# Patient Record
Sex: Male | Born: 1980 | Race: Black or African American | Hispanic: No | Marital: Married
Health system: Midwestern US, Community
[De-identification: ages and names within clinical notes are randomized; demographics above are authoritative.]

## PROBLEM LIST (undated history)

## (undated) DIAGNOSIS — Z932 Ileostomy status: Secondary | ICD-10-CM

## (undated) DIAGNOSIS — E119 Type 2 diabetes mellitus without complications: Principal | ICD-10-CM

## (undated) DIAGNOSIS — K5792 Diverticulitis of intestine, part unspecified, without perforation or abscess without bleeding: Principal | ICD-10-CM

## (undated) DIAGNOSIS — Z1211 Encounter for screening for malignant neoplasm of colon: Principal | ICD-10-CM

## (undated) DIAGNOSIS — Z9049 Acquired absence of other specified parts of digestive tract: Secondary | ICD-10-CM

## (undated) DIAGNOSIS — K648 Other hemorrhoids: Principal | ICD-10-CM

## (undated) DIAGNOSIS — Z9889 Other specified postprocedural states: Principal | ICD-10-CM

## (undated) DIAGNOSIS — G4733 Obstructive sleep apnea (adult) (pediatric): Secondary | ICD-10-CM

## (undated) DIAGNOSIS — Z48815 Encounter for surgical aftercare following surgery on the digestive system: Principal | ICD-10-CM

---

## 2014-05-15 ENCOUNTER — Ambulatory Visit (INDEPENDENT_AMBULATORY_CARE_PROVIDER_SITE_OTHER): Payer: Managed Care, Other (non HMO) | Admitting: Emergency Medicine

## 2014-05-15 VITALS — BP 140/90 | HR 73 | Temp 99.8°F | Resp 16 | Ht 66.0 in | Wt 187.0 lb

## 2014-05-15 DIAGNOSIS — J40 Bronchitis, not specified as acute or chronic: Secondary | ICD-10-CM

## 2014-05-15 DIAGNOSIS — J0140 Acute pansinusitis, unspecified: Secondary | ICD-10-CM

## 2014-05-15 DIAGNOSIS — J209 Acute bronchitis, unspecified: Secondary | ICD-10-CM

## 2014-05-15 MED ORDER — ALBUTEROL SULFATE HFA 108 (90 BASE) MCG/ACT IN AERS: 2.0000 | INHALATION_SPRAY | RESPIRATORY_TRACT | Status: AC | PRN

## 2014-05-15 MED ORDER — ALBUTEROL SULFATE (2.5 MG/3ML) 0.083% IN NEBU
5.0000 mg | INHALATION_SOLUTION | Freq: Once | RESPIRATORY_TRACT | Status: AC
  Administered 2014-05-15: 5 mg via RESPIRATORY_TRACT

## 2014-05-15 MED ORDER — IPRATROPIUM BROMIDE 0.02 % IN SOLN
0.5000 mg | Freq: Once | RESPIRATORY_TRACT | Status: AC
Start: 2014-05-15 — End: 2014-05-15
  Administered 2014-05-15: 0.5 mg via RESPIRATORY_TRACT

## 2014-05-15 MED ORDER — AMOXICILLIN-POT CLAVULANATE 875-125 MG PO TABS: 1.0000 | ORAL_TABLET | Freq: Two times a day (BID) | ORAL | Status: DC

## 2014-05-15 MED ORDER — PSEUDOEPHEDRINE-GUAIFENESIN ER 60-600 MG PO TB12: 1.0000 | ORAL_TABLET | Freq: Two times a day (BID) | ORAL | Status: DC

## 2014-05-15 MED ORDER — HYDROCOD POLST-CHLORPHEN POLST 10-8 MG/5ML PO LQCR: 5.0000 mL | Freq: Two times a day (BID) | ORAL | Status: DC | PRN

## 2014-05-15 NOTE — Patient Instructions (Signed)
Metered Dose Inhaler (No Spacer Used)  Inhaled medicines are the basis of treatment for asthma and other breathing problems. Inhaled medicine can only be effective if used properly. Good technique assures that the medicine reaches the lungs.  Metered dose inhalers (MDIs) are used to deliver a variety of inhaled medicines. These include quick relief or rescue medicines (such as bronchodilators) and controller medicines (such as corticosteroids). The medicine is delivered by pushing down on a metal canister to release a set amount of spray.  If you are using different kinds of inhalers, use your quick relief medicine to open the airways 10-15 minutes before using a steroid, if instructed to do so by your health care provider. If you are unsure which inhalers to use and the order of using them, ask your health care provider, nurse, or respiratory therapist.  HOW TO USE THE INHALER  1. Remove the cap from the inhaler.  2. If you are using the inhaler for the first time, you will need to prime it. Shake the inhaler for 5 seconds and release four puffs into the air, away from your face. Ask your health care provider or pharmacist if you have questions about priming your inhaler.  3. Shake the inhaler for 5 seconds before each breath in (inhalation).  4. Position the inhaler so that the top of the canister faces up.  5. Put your index finger on the top of the medicine canister. Your thumb supports the bottom of the inhaler.  6. Open your mouth.  7. Either place the inhaler between your teeth and place your lips tightly around the mouthpiece, or hold the inhaler 1-2 inches away from your open mouth. If you are unsure of which technique to use, ask your health care provider.  8. Breathe out (exhale) normally and as completely as possible.  9. Press the canister down with the index finger to release the medicine.  10. At the same time as the canister is pressed, inhale deeply and slowly until your lungs are completely filled.  This should take 4-6 seconds. Keep your tongue down.  11. Hold the medicine in your lungs for 5-10 seconds (10 seconds is best). This helps the medicine get into the small airways of your lungs.  12. Breathe out slowly, through pursed lips. Whistling is an example of pursed lips.  13. Wait at least 1 minute between puffs. Continue with the above steps until you have taken the number of puffs your health care provider has ordered. Do not use the inhaler more than your health care provider directs you to.  14. Replace the cap on the inhaler.  15. Follow the directions from your health care provider or the inhaler insert for cleaning the inhaler.  If you are using a steroid inhaler, after your last puff, rinse your mouth with water, gargle, and spit out the water. Do not swallow the water.  AVOID:  · Inhaling before or after starting the spray of medicine. It takes practice to coordinate your breathing with triggering the spray.  · Inhaling through the nose (rather than the mouth) when triggering the spray.  HOW TO DETERMINE IF YOUR INHALER IS FULL OR NEARLY EMPTY  You cannot know when an inhaler is empty by shaking it. Some inhalers are now being made with dose counters. Ask your health care provider for a prescription that has a dose counter if you feel you need that extra help. If your inhaler does not have a counter, ask your health care   provider to help you determine the date you need to refill your inhaler. Write the refill date on a calendar or your inhaler canister. Refill your inhaler 7-10 days before it runs out. Be sure to keep an adequate supply of medicine. This includes making sure it has not expired, and making sure you have a spare inhaler.  SEEK MEDICAL CARE IF:  · Symptoms are only partially relieved with your inhaler.  · You are having trouble using your inhaler.  · You experience an increase in phlegm.  SEEK IMMEDIATE MEDICAL CARE IF:  · You feel little or no relief with your inhalers. You are still  wheezing and feeling shortness of breath, tightness in your chest, or both.  · You have dizziness, headaches, or a fast heart rate.  · You have chills, fever, or night sweats.  · There is a noticeable increase in phlegm production, or there is blood in the phlegm.  MAKE SURE YOU:  · Understand these instructions.  · Will watch your condition.  · Will get help right away if you are not doing well or get worse.  Document Released: 03/27/2007 Document Revised: 10/14/2013 Document Reviewed: 11/15/2012  ExitCare® Patient Information ©2015 [employer]. This information is not intended to replace advice given to you by your health care provider. Make sure you discuss any questions you have with your health care provider.

## 2014-05-15 NOTE — Progress Notes (Signed)
[facility] [address], [place] [id number] [phone]  Date:  05/15/2014   Name:  Travis Short   DOB:  [birth year]   MRN:  [id number]  PCP:  No PCP Per Patient    Chief Complaint: Cough and Bumps   History of Present Illness:  Travis Short is a 33 y.o. very pleasant male patient who presents with the following:  Ill past week with nasal congestion and post nasal drip.  Has mucopurulent nasal discharge. Cough productive mucopurulent sputum associated with wheezing and exertional shortness of breath  No history of asthma No nausea or vomiting. Says developed a fever last night. No rash or stool change No improvement with over the counter medications or other home remedies.  Denies other complaint or health concern today.   There are no active problems to display for this patient.   History reviewed. No pertinent past medical history.  History reviewed. No pertinent past surgical history.  History  Substance Use Topics  . Smoking status: Current Every Day Smoker  . Smokeless tobacco: Not on file  . Alcohol Use: Not on file    History reviewed. No pertinent family history.  Allergies  Allergen Reactions  . Shellfish Allergy Anaphylaxis    Medication list has been reviewed and updated.  No current outpatient prescriptions on file prior to visit.   No current facility-administered medications on file prior to visit.    Review of Systems:  As per HPI, otherwise negative.    Physical Examination: Filed Vitals:   05/15/14 1400  BP: 140/90  Pulse: 73  Temp: 99.8 F (37.7 C)  Resp: 16   Filed Vitals:   05/15/14 1400  Height: 5\' 6"  (1.676 m)  Weight: 187 lb (84.823 kg)   Body mass index is 30.2 kg/(m^2). Ideal Body Weight: Weight in (lb) to have BMI = 25: 154.6  GEN: WDWN, NAD, Non-toxic, A & O x 3 HEENT: Atraumatic, Normocephalic. Neck supple. No masses, No LAD. Ears and Nose: No external deformity. CV: RRR, No  M/G/R. No JVD. No thrill. No extra heart sounds. PULM: diffuse  Wheezes, no crackles, rhonchi. No retractions. No resp. distress. No accessory muscle use. ABD: S, NT, ND, +BS. No rebound. No HSM. EXTR: No c/c/e NEURO Normal gait.  PSYCH: Normally interactive. Conversant. Not depressed or anxious appearing.  Calm demeanor.    Assessment and Plan: Sinusitis  Bronchitis with bronchospasm Albuterol tussionex augmentin mucinenex  Signed,  Phillips OdorJeffery Lason Eveland, MD

## 2014-11-03 ENCOUNTER — Ambulatory Visit (INDEPENDENT_AMBULATORY_CARE_PROVIDER_SITE_OTHER): Payer: Managed Care, Other (non HMO) | Admitting: Family Medicine

## 2014-11-03 ENCOUNTER — Ambulatory Visit (INDEPENDENT_AMBULATORY_CARE_PROVIDER_SITE_OTHER): Payer: Managed Care, Other (non HMO)

## 2014-11-03 VITALS — BP 116/80 | HR 56 | Temp 98.4°F | Resp 16 | Ht 66.0 in | Wt 208.0 lb

## 2014-11-03 DIAGNOSIS — S4991XA Unspecified injury of right shoulder and upper arm, initial encounter: Secondary | ICD-10-CM | POA: Diagnosis not present

## 2014-11-03 DIAGNOSIS — L739 Follicular disorder, unspecified: Secondary | ICD-10-CM

## 2014-11-03 MED ORDER — DICLOFENAC SODIUM 75 MG PO TBEC: 75.0000 mg | DELAYED_RELEASE_TABLET | Freq: Two times a day (BID) | ORAL | Status: AC

## 2014-11-03 MED ORDER — TETRACYCLINE HCL 500 MG PO CAPS: 500.0000 mg | ORAL_CAPSULE | Freq: Every day | ORAL | Status: AC

## 2014-11-03 NOTE — Progress Notes (Signed)
° °  Subjective:  This chart was scribed for Elvina SidleKurt Lauenstein, MD by Stann Oresung-Kai Tsai, Medical Scribe. This patient was seen in room 12 and the patient's care was started 12:01 PM.    Patient ID: Travis Short, male    DOB: [birth year], 34 y.o.   MRN: [id number]  HPI Travis Short is a 34 y.o. male who [facility] complaining of gradual onset intermittent bumps on the head.   He also has some shoulder pain starting about 2 months ago due to work [employer]ist[employer]. He's never had physical therapy for it. He took ibuprofen for it with some relief.  He has also fatigue from early morning shift.   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     Review of Systems  Musculoskeletal: Positive for arthralgias (right shoulder).  Skin: Positive for rash (bumps on head).       Objective:   Physical Exam  Nursing note and vitals reviewed.  patient has a normal appearance of his right shoulder. He has minimal tenderness in the joint line anteriorly, his range of motion is complete but he is having trouble moving it quickly both with internal rotation and abduction. He has good muscle definition in his arm and the rest the range of motion of his right arm is normal. [facility] reading (PRIMARY) by  Dr. Milus GlazierLauenstein: Right shoulder shows no dislocation, no bony abnormality..   Assessment & Plan:   This chart was scribed in my presence and reviewed by me personally.    ICD-9-CM ICD-10-CM   1. Right shoulder injury, initial encounter 959.2 S49.91XA DG Shoulder Right     DG Shoulder Right  2. Folliculitis 704.8 L73.9 tetracycline (ACHROMYCIN,SUMYCIN) 500 MG capsule   This appears to be an ongoing shoulder strain. He has some mild folliculitis in his occipital scalp.  I'm thinking that if he does some range of motion exercises and takes an anti-inflammatory for the next couple weeks, the shoulder should improve.  Signed, Elvina SidleKurt Lauenstein, MD

## 2014-11-03 NOTE — Patient Instructions (Signed)
I like you to use the resistance band to strengthen the rotator cuff muscles of the right shoulder which has been injured. Please perform exercises demonstrated 3 times a day for the next 2 weeks and take the anti-inflammatory medicine as directed.  The folliculitis is very difficult to treat. Usually does respond to tetracycline taken once a day. You may want to try this for a month or 2 and then see if it doesn't stay away.

## 2014-11-04 ENCOUNTER — Telehealth: Payer: Self-pay

## 2014-11-04 DIAGNOSIS — L739 Follicular disorder, unspecified: Secondary | ICD-10-CM

## 2014-11-04 MED ORDER — DOXYCYCLINE HYCLATE 100 MG PO TABS: 100.0000 mg | ORAL_TABLET | Freq: Every day | ORAL | Status: AC

## 2014-11-04 NOTE — Telephone Encounter (Signed)
Pharm reports that tetracycline is not being manufactured in any strength any more. Dr L, can you send in a Rx for an alternative?

## 2014-11-04 NOTE — Telephone Encounter (Signed)
If tetracycline is no longer available, why is it in EPIC????

## 2014-11-04 NOTE — Telephone Encounter (Signed)
I have no idea Dr, we did not even know this until now. Epic fails to update things like this. I apologize, I can research how to fix this for the future.

## 2018-04-10 LAB — COMPREHENSIVE METABOLIC PANEL
ALT: 45 U/L — ABNORMAL HIGH (ref 0–41)
AST: 36 U/L (ref 0–40)
Albumin/Globulin Ratio: 1.6 mmol/L (ref 1.00–2.00)
Albumin: 4.4 g/dL (ref 3.5–5.2)
Alk Phosphatase: 75 U/L (ref 40–130)
Anion Gap: 8 mmol/L (ref 2–17)
BUN: 17 mg/dL (ref 6–20)
CO2: 29 mmol/L (ref 22–29)
Calcium: 9.6 mg/dL (ref 8.6–10.0)
Chloride: 104 mmol/L (ref 98–107)
Creatinine: 1.1 mg/dL (ref 0.7–1.3)
GFR African American: 97 mL/min/{1.73_m2} (ref >=90)
GFR Non-African American: 84 mL/min/{1.73_m2} — ABNORMAL LOW (ref >=90)
Globulin: 3 g/dL (ref 1.9–4.4)
Glucose: 102 mg/dL — ABNORMAL HIGH (ref 70–99)
OSMOLALITY CALCULATED: 283 mOsm/kg (ref 270–287)
Potassium: 4.6 mmol/L (ref 3.5–5.3)
Sodium: 141 mmol/L (ref 135–145)
Total Bilirubin: 0.3 mg/dL (ref 0.00–1.20)
Total Protein: 7.2 g/dL (ref 6.4–8.3)

## 2018-04-10 LAB — HEMOGLOBIN A1C
Est. Avg. Glucose, WB: 134
Est. Avg. Glucose-calculated: 147
Hemoglobin A1C: 6.3 % — ABNORMAL HIGH (ref 4.0–6.0)

## 2018-04-10 LAB — LIPID PANEL
Chol/HDL Ratio: 4.9 — ABNORMAL HIGH (ref 0.0–4.4)
Cholesterol: 165 mg/dL (ref 100–200)
HDL: 34 mg/dL — ABNORMAL LOW (ref 55–72)
LDL Cholesterol: 85.4 mg/dL (ref 0.0–100.0)
LDL/HDL Ratio: 2.5
Triglycerides: 228 mg/dL — ABNORMAL HIGH (ref 0–149)
VLDL: 45.6 mg/dL — ABNORMAL HIGH (ref 5.0–40.0)

## 2018-04-10 LAB — CBC WITH AUTO DIFFERENTIAL
Absolute Eos #: 0.1 10*3/uL (ref 0.0–0.5)
Absolute Lymph #: 2.2 10*3/uL (ref 1.0–3.2)
Absolute Mono #: 0.4 10*3/uL (ref 0.3–1.0)
Basophils %: 0.8 % (ref 0.0–2.0)
Eosinophils %: 2.6 % (ref 0.0–7.0)
Hematocrit: 41.7 % (ref 38.0–52.0)
Hemoglobin: 13.5 g/dL (ref 13.0–17.3)
Lymphocytes: 53.6 % — ABNORMAL HIGH (ref 15.0–45.0)
MCH: 23 pg — ABNORMAL LOW (ref 27.0–34.5)
MCHC: 32.4 g/dL (ref 32.0–36.0)
MCV: 71.1 fL — ABNORMAL LOW (ref 84.0–100.0)
MPV: 9.5 fL (ref 7.2–13.2)
Monocytes: 9.7 % (ref 4.0–12.0)
Neutrophils %: 33.3 % — ABNORMAL LOW (ref 42.0–74.0)
Neutrophils Absolute: 1.4 10*3/uL — ABNORMAL LOW (ref 1.6–7.3)
Platelets: 198 10*3/uL (ref 140–440)
RBC: 5.86 x10e6/mcL — ABNORMAL HIGH (ref 4.00–5.60)
RDW: 14.8 % (ref 11.0–16.0)
WBC: 4.2 10*3/uL (ref 3.8–10.6)

## 2018-04-10 LAB — VITAMIN D 25 HYDROXY: Vit D, 25-Hydroxy: 21.9 ng/mL — ABNORMAL LOW (ref 30.0–90.0)

## 2018-04-10 LAB — TSH WITH REFLEX TO FT4: TSH: 3.11 mcIU/mL (ref 0.358–3.740)

## 2018-12-19 LAB — COMPREHENSIVE METABOLIC PANEL
ALT: 41 U/L (ref 0–41)
AST: 24 U/L (ref 0–40)
Albumin/Globulin Ratio: 1.3 mmol/L (ref 1.00–2.00)
Albumin: 3.8 g/dL (ref 3.5–5.2)
Alk Phosphatase: 74 U/L (ref 40–130)
Anion Gap: 9 mmol/L (ref 2–17)
BUN: 13 mg/dL (ref 6–20)
CO2: 26 mmol/L (ref 22–29)
Calcium: 9 mg/dL (ref 8.6–10.0)
Chloride: 104 mmol/L (ref 98–107)
Creatinine: 1 mg/dL (ref 0.7–1.3)
GFR African American: 109 mL/min/{1.73_m2} (ref >=90)
GFR Non-African American: 94 mL/min/{1.73_m2} (ref >=90)
Globulin: 3 g/dL (ref 1.9–4.4)
Glucose: 99 mg/dL (ref 70–99)
OSMOLALITY CALCULATED: 278 mOsm/kg (ref 270–287)
Potassium: 4.5 mmol/L (ref 3.5–5.3)
Sodium: 140 mmol/L (ref 135–145)
Total Bilirubin: 0.3 mg/dL (ref 0.00–1.20)
Total Protein: 6.8 g/dL (ref 6.4–8.3)

## 2018-12-19 LAB — CBC WITH AUTO DIFFERENTIAL
Absolute Baso #: 0 10*3/uL (ref 0.0–0.2)
Absolute Eos #: 0.2 10*3/uL (ref 0.0–0.5)
Absolute Lymph #: 2.5 10*3/uL (ref 1.0–3.2)
Absolute Mono #: 0.4 10*3/uL (ref 0.3–1.0)
Basophils %: 0.6 % (ref 0.0–2.0)
Eosinophils %: 3.4 % (ref 0.0–7.0)
Hematocrit: 43.8 % (ref 38.0–52.0)
Hemoglobin: 13.4 g/dL (ref 12.0–17.3)
Immature Grans (Abs): 0 10*3/uL
Immature Granulocytes: 0.2 % (ref 0.1–0.6)
Lymphocytes: 53 % — ABNORMAL HIGH (ref 15.0–45.0)
MCH: 21.9 pg — ABNORMAL LOW (ref 27.0–34.5)
MCHC: 30.6 g/dL — ABNORMAL LOW (ref 32.0–36.0)
MCV: 71.5 fL — ABNORMAL LOW (ref 84.0–100.0)
MPV: 10.9 fL (ref 7.2–13.2)
Monocytes: 8.9 % (ref 4.0–12.0)
NRBC Absolute: 0 10*3/uL
NRBC Automated: 0 % (ref 0.0–0.2)
Neutrophils %: 33.9 % — ABNORMAL LOW (ref 42.0–74.0)
Neutrophils Absolute: 1.6 10*3/uL (ref 1.6–7.3)
Platelets: 384 10*3/uL (ref 140–440)
RBC: 6.13 x10e6/mcL — ABNORMAL HIGH (ref 4.00–5.20)
RDW: 14.1 % (ref 11.0–16.0)
WBC: 4.7 10*3/uL (ref 3.8–10.6)

## 2018-12-19 LAB — HEMOGLOBIN A1C
Est. Avg. Glucose, WB: 146
Est. Avg. Glucose-calculated: 161
Hemoglobin A1C: 6.7 % — ABNORMAL HIGH (ref 4.0–6.0)

## 2018-12-19 LAB — LIPID PANEL
Chol/HDL Ratio: 4.7 — ABNORMAL HIGH (ref 0.0–4.4)
Cholesterol: 121 mg/dL (ref 100–200)
HDL: 26 mg/dL — ABNORMAL LOW (ref 55–72)
LDL Cholesterol: 66 mg/dL (ref 0.0–100.0)
LDL/HDL Ratio: 2.5
Triglycerides: 145 mg/dL (ref 0–149)
VLDL: 29 mg/dL (ref 5.0–40.0)

## 2018-12-19 LAB — TSH WITH REFLEX TO FT4: TSH: 1.58 mcIU/mL (ref 0.358–3.740)

## 2018-12-28 LAB — COVID-19: CORONAVIRUS (COVID-19), NAA: NOT DETECTED

## 2019-01-25 LAB — CBC WITH MANUAL DIFFERENTIAL
Absolute Eos #: 0.1 10*3/uL (ref 0.0–0.5)
Absolute Lymph #: 2.6 10*3/uL (ref 1.0–3.2)
Absolute Mono #: 0.7 10*3/uL (ref 0.3–1.0)
Eosinophils %: 1 % (ref 0–7)
Hematocrit: 43.2 % (ref 38.0–52.0)
Hemoglobin: 13.4 g/dL (ref 13.0–17.3)
Lymphocytes: 48 % — ABNORMAL HIGH (ref 15–45)
Lymphs, Absolute Count, Reactive: 0.2 10*3/uL
MCH: 22.4 pg — ABNORMAL LOW (ref 27.0–34.5)
MCHC: 31 g/dL — ABNORMAL LOW (ref 32.0–36.0)
MCV: 72.2 fL — ABNORMAL LOW (ref 84.0–100.0)
MPV: 10.7 fL (ref 7.2–13.2)
Monocytes: 12 % (ref 4–12)
Neutrophils %. Manual count: 35 % — ABNORMAL LOW (ref 42–74)
Neutrophils Absolute: 1.9 10*3/uL (ref 1.6–7.3)
Platelet Estimate: ADEQUATE
Platelets: 225 10*3/uL (ref 140–440)
RBC Morphology: NORMAL
RBC: 5.98 x10e6/mcL — ABNORMAL HIGH (ref 4.00–5.60)
RDW: 16.5 % — ABNORMAL HIGH (ref 11.0–16.0)
Reactive Lymphocytes: 4 %
WBC: 5.5 10*3/uL (ref 3.8–10.6)

## 2019-01-25 LAB — IRON AND TIBC
Iron Saturation: 33 % (ref 20–40)
Iron: 91 ug/dL (ref 59–158)
TIBC: 278 ug/dL (ref 250–450)
UIBC: 187 ug/dL (ref 112.0–347.0)

## 2019-01-25 LAB — FERRITIN: Ferritin: 215.8 ng/mL (ref 30.0–400.0)

## 2019-01-25 LAB — FOLATE: Folate: 6.81 ng/mL (ref 4.80–24.20)

## 2019-01-25 LAB — RETICULOCYTES
RBC: 5.98 x10e6/mcL — ABNORMAL HIGH (ref 4.00–5.60)
Retic Ct Abs: 0.0765 /mcL (ref 0.0235–0.1220)
Retic Ct Pct: 1.3 % (ref 0.5–2.0)

## 2019-01-25 LAB — LACTATE DEHYDROGENASE: LD: 170 U/L (ref 135–225)

## 2019-01-25 LAB — TRANSFERRIN: Transferrin: 233.3 mg/dL (ref 200.0–360.0)

## 2019-01-25 LAB — VITAMIN B12: Vitamin B-12: 500 pg/mL (ref 232–1245)

## 2019-01-28 LAB — HEMOGLOBINOPATHY EVALUATION
Hemoglobin A2: 2.2 % (ref 1.8–3.2)
Hemoglobin A: 97.8 % (ref 96.4–98.8)
Hemoglobin C: 0 %
Hemoglobin F: 0 % (ref 0.0–2.0)
Hemoglobin S: 0 %
Hemoglobin Variant: 0 %
Hgb Solubility: NEGATIVE

## 2019-06-21 LAB — COMPREHENSIVE METABOLIC PANEL
ALT: 36 U/L (ref 0–41)
AST: 28 U/L (ref 0–40)
Albumin/Globulin Ratio: 1.7 mmol/L (ref 1.00–2.00)
Albumin: 4.4 g/dL (ref 3.5–5.2)
Alk Phosphatase: 71 U/L (ref 40–130)
Anion Gap: 10 mmol/L (ref 2–17)
BUN: 14 mg/dL (ref 6–20)
CO2: 27 mmol/L (ref 22–29)
Calcium: 9.7 mg/dL (ref 8.6–10.0)
Chloride: 102 mmol/L (ref 98–107)
Creatinine: 0.9 mg/dL (ref 0.7–1.3)
GFR African American: 123 mL/min/{1.73_m2} (ref >=90)
GFR Non-African American: 106 mL/min/{1.73_m2} (ref >=90)
Globulin: 3 g/dL (ref 1.9–4.4)
Glucose: 93 mg/dL (ref 70–99)
OSMOLALITY CALCULATED: 278 mOsm/kg (ref 270–287)
Potassium: 4.5 mmol/L (ref 3.5–5.3)
Sodium: 139 mmol/L (ref 135–145)
Total Bilirubin: 0.6 mg/dL (ref 0.00–1.20)
Total Protein: 7 g/dL (ref 6.4–8.3)

## 2019-06-21 LAB — CBC WITH AUTO DIFFERENTIAL
Absolute Baso #: 0 10*3/uL (ref 0.0–0.2)
Absolute Eos #: 0.2 10*3/uL (ref 0.0–0.5)
Absolute Lymph #: 2.2 10*3/uL (ref 1.0–3.2)
Absolute Mono #: 0.5 10*3/uL (ref 0.3–1.0)
Basophils %: 0.6 % (ref 0.0–2.0)
Eosinophils %: 3.3 % (ref 0.0–7.0)
Hematocrit: 47.5 % (ref 38.0–52.0)
Hemoglobin: 15 g/dL (ref 13.0–17.3)
Immature Grans (Abs): 0.02 10*3/uL (ref 0.00–0.06)
Immature Granulocytes: 0.4 % (ref 0.1–0.6)
Lymphocytes: 45.5 % — ABNORMAL HIGH (ref 15.0–45.0)
MCH: 22.5 pg — ABNORMAL LOW (ref 27.0–34.5)
MCHC: 31.6 g/dL — ABNORMAL LOW (ref 32.0–36.0)
MCV: 71.1 fL — ABNORMAL LOW (ref 84.0–100.0)
MPV: 11.2 fL (ref 7.2–13.2)
Monocytes: 10 % (ref 4.0–12.0)
NRBC Absolute: 0 10*3/uL (ref 0.000–0.012)
NRBC Automated: 0 % (ref 0.0–0.2)
Neutrophils %: 40.2 % — ABNORMAL LOW (ref 42.0–74.0)
Neutrophils Absolute: 2 10*3/uL (ref 1.6–7.3)
Platelets: 219 10*3/uL (ref 140–440)
RBC: 6.68 x10e6/mcL — ABNORMAL HIGH (ref 4.00–5.60)
RDW: 17.1 % — ABNORMAL HIGH (ref 11.0–16.0)
WBC: 4.9 10*3/uL (ref 3.8–10.6)

## 2019-06-21 LAB — LIPID PANEL
Chol/HDL Ratio: 4.8 — ABNORMAL HIGH (ref 0.0–4.4)
Cholesterol: 184 mg/dL (ref 100–200)
HDL: 38 mg/dL — ABNORMAL LOW (ref 55–72)
LDL Cholesterol: 111 mg/dL — ABNORMAL HIGH (ref 0.0–100.0)
LDL/HDL Ratio: 2.9
Triglycerides: 176 mg/dL — ABNORMAL HIGH (ref 0–149)
VLDL: 35.2 mg/dL (ref 5.0–40.0)

## 2019-06-21 LAB — HEMOGLOBIN A1C
Est. Avg. Glucose, WB: 131
Est. Avg. Glucose-calculated: 143
Hemoglobin A1C: 6.2 % — ABNORMAL HIGH (ref 4.0–6.0)

## 2019-06-22 LAB — TSH WITH REFLEX TO FT4: TSH: 1.78 mcIU/mL (ref 0.358–3.740)

## 2020-02-25 LAB — BASIC METABOLIC PANEL
Anion Gap: 9 mmol/L (ref 2–17)
BUN: 14 mg/dL (ref 6–20)
CO2: 25 mmol/L (ref 22–29)
Calcium: 9.5 mg/dL (ref 8.6–10.0)
Chloride: 106 mmol/L (ref 98–107)
Creatinine: 1 mg/dL (ref 0.7–1.3)
GFR African American: 108 mL/min/{1.73_m2} (ref >=90)
GFR Non-African American: 93 mL/min/{1.73_m2} (ref >=90)
Glucose: 106 mg/dL — ABNORMAL HIGH (ref 70–99)
OSMOLALITY CALCULATED: 280 mOsm/kg (ref 270–287)
Potassium: 4.2 mmol/L (ref 3.5–5.3)
Sodium: 140 mmol/L (ref 135–145)

## 2020-02-25 LAB — HEMOGLOBIN A1C
Est. Avg. Glucose, WB: 131
Est. Avg. Glucose-calculated: 143
Hemoglobin A1C: 6.2 % — ABNORMAL HIGH (ref 4.0–6.0)

## 2020-08-18 LAB — BASIC METABOLIC PANEL
Anion Gap: 14 mmol/L (ref 2–17)
BUN: 16 mg/dL (ref 6–20)
CO2: 22 mmol/L (ref 22–29)
Calcium: 9.5 mg/dL (ref 8.6–10.0)
Chloride: 104 mmol/L (ref 98–107)
Creatinine: 0.9 mg/dL (ref 0.7–1.3)
GFR African American: 122 mL/min/{1.73_m2} (ref >=90)
GFR Non-African American: 105 mL/min/{1.73_m2} (ref >=90)
Glucose: 93 mg/dL (ref 70–99)
OSMOLALITY CALCULATED: 280 mOsm/kg (ref 270–287)
Potassium: 4.5 mmol/L (ref 3.5–5.3)
Sodium: 140 mmol/L (ref 135–145)

## 2020-08-18 LAB — HEMOGLOBIN A1C
Est. Avg. Glucose, WB: 123
Est. Avg. Glucose-calculated: 133
Hemoglobin A1C: 5.9 % (ref 4.0–6.0)

## 2020-08-31 LAB — CBC WITH AUTO DIFFERENTIAL
Absolute Baso #: 0 10*3/uL (ref 0.0–0.2)
Absolute Eos #: 0.3 10*3/uL (ref 0.0–0.5)
Absolute Lymph #: 2.4 10*3/uL (ref 1.0–3.2)
Absolute Mono #: 0.3 10*3/uL (ref 0.3–1.0)
Basophils %: 0.9 % (ref 0.0–2.0)
Eosinophils %: 5.8 % (ref 0.0–7.0)
Hematocrit: 43.2 % (ref 38.0–52.0)
Hemoglobin: 13.5 g/dL (ref 13.0–17.3)
Immature Grans (Abs): 0.01 10*3/uL (ref 0.00–0.06)
Immature Granulocytes: 0.2 % (ref 0.0–0.6)
Lymphocytes: 51 % — ABNORMAL HIGH (ref 15.0–45.0)
MCH: 22.4 pg — ABNORMAL LOW (ref 27.0–34.5)
MCHC: 31.3 g/dL — ABNORMAL LOW (ref 32.0–36.0)
MCV: 71.8 fL — ABNORMAL LOW (ref 84.0–100.0)
MPV: 11.3 fL (ref 7.2–13.2)
Monocytes: 6.8 % (ref 4.0–12.0)
NRBC Absolute: 0 10*3/uL (ref 0.000–0.012)
NRBC Automated: 0 % (ref 0.0–0.2)
Neutrophils %: 35.3 % — ABNORMAL LOW (ref 42.0–74.0)
Neutrophils Absolute: 1.7 10*3/uL (ref 1.6–7.3)
Platelets: 231 10*3/uL (ref 140–440)
RBC: 6.02 x10e6/mcL — ABNORMAL HIGH (ref 4.00–5.60)
RDW: 15.9 % (ref 11.0–16.0)
WBC: 4.7 10*3/uL (ref 3.8–10.6)

## 2021-05-27 NOTE — Telephone Encounter (Addendum)
Patient called in to request a refill on:  albuterol sulfate HFA (PROVENTIL;VENTOLIN;PROAIR) 108 (90 Base) MCG/ACT inhaler but phone disconnected before I could obtain pharmacy info. If he calls back please update.

## 2021-05-28 MED ORDER — ALBUTEROL SULFATE HFA 108 (90 BASE) MCG/ACT IN AERS
108 (90 Base) MCG/ACT | RESPIRATORY_TRACT | 5 refills | Status: DC
Start: 2021-05-28 — End: 2023-10-31

## 2021-05-28 NOTE — Telephone Encounter (Signed)
Pt called back and would like [phone].

## 2021-05-28 NOTE — Telephone Encounter (Signed)
Pt would like a call once script is sent

## 2021-08-20 ENCOUNTER — Encounter: Attending: Family Medicine | Primary: Family Medicine

## 2021-09-07 NOTE — Telephone Encounter (Signed)
LVM for patient to callback to schedule a follow-up appointment.

## 2021-09-10 NOTE — Telephone Encounter (Signed)
LVM x 2

## 2021-09-27 NOTE — Telephone Encounter (Signed)
Contact us letter mailed.

## 2022-06-14 NOTE — Telephone Encounter (Signed)
Patient is calling he was admitted to [facility] on Saturday 06/11/2022. They have questions about his blood pressure medication. He would like a call back from Dr. Juanita Craver or the nurse.       Please assists.

## 2022-06-14 NOTE — Telephone Encounter (Signed)
Spoke with Dedra Skeens and scheduled visit for Friday advised to bring in records to appt

## 2022-06-14 NOTE — Telephone Encounter (Signed)
Patient was admitted into [facility] admitted on 12/31 and Discharged on 1/2 for Fainting and No Pulse.    Please call Alma back to schedule the next available [place] soon.

## 2022-06-14 NOTE — Telephone Encounter (Signed)
Called spoke with patient and advised we have not seen in almost 2 years but last visit we had him on amlodipine 10mg  and losartan 100 advised once dc from hospital to let us know bc will need hospital fu appt

## 2022-06-17 ENCOUNTER — Ambulatory Visit
Admit: 2022-06-17 | Discharge: 2022-06-17 | Payer: BLUE CROSS/BLUE SHIELD | Attending: Family Medicine | Primary: Family Medicine

## 2022-06-17 DIAGNOSIS — E119 Type 2 diabetes mellitus without complications: Secondary | ICD-10-CM

## 2022-06-17 NOTE — Addendum Note (Signed)
Addended by: Earlene Plater on: 06/17/2022 04:39 PM     Modules accepted: Orders

## 2022-06-17 NOTE — Progress Notes (Signed)
Chief Complaint:     Follow-up and Follow-Up from Hospital (Needs to sign release for records )         ASSESSMENT/PLAN:    ICD-10-CM    1. Type 2 diabetes mellitus without complication, without long-term current use of insulin (HCC) Stable E11.9 Hemoglobin A1C     Hemoglobin A1C      2. Essential (primary) hypertension Controlled I10 Comprehensive Metabolic Panel     Lipid Panel     TSH with Reflex     TSH with Reflex     Lipid Panel     Comprehensive Metabolic Panel     Routine Venipuncture (64332)     Intl Polysomnography Sleep Study more than 6 hours      3. Daytime sleepiness  R40.0 Intl Polysomnography Sleep Study more than 6 hours      4. Snoring  R06.83 Intl Polysomnography Sleep Study more than 6 hours      5. Class 2 obesity due to excess calories without serious comorbidity with body mass index (BMI) of 37.0 to 37.9 in adult  E66.09 Comprehensive Metabolic Panel    R51.88 Lipid Panel     TSH with Reflex     TSH with Reflex     Lipid Panel     Comprehensive Metabolic Panel     Intl Polysomnography Sleep Study more than 6 hours        Reviewed hospital records. Will check routine labs. Will order sleep study-- discussed with patient need for this. Will also refer to sleep med.     Recommend healthy eating and routine exercise.         Return in about 3 months (around 09/16/2022).         Subjective   SUBJECTIVE/OBJECTIVE:    Patient with past medical history of hypertension, diabetes, obesity is here to follow-up recent hospitalization after falling down stairs while intoxicated.     He was found unresponsive.     Pan CT found no concerning findings.     Patient was intubated short-term. Noted difficulty with extubation and suspected sleep apnea. Patient does snore and also has daytime sleepiness. Has been recommended to have sleep study in the past due to previous CT neck findings of the pharyngeal airway.     Patient has been taking blood pressure meds as rx.       ROS as per HPI or otherwise negative.            Objective   Vitals:    06/17/22 1139   BP: 128/88   Pulse: 74   SpO2: 95%       GENERAL: The patient is in no apparent distress. Alert and oriented. Vital Signs Reviewed HEENT: Head is normocephalic and atraumatic. Extraocular muscles are intact. Pupils are equal, Conjunctiva normal. Moist Mucous membranes. Posterior pharynx clear of any exudate or lesions. NECK: Supple. No Lymphadenopathy LUNGS: Clear to auscultation bilaterally. Breath Sounds equal. HEART: Regular rate and rhythm without murmur. No edema ABDOMEN: Soft, nontender, and nondistended. . Musculoskeletal: No deformity. Gait WNL. No swelling noted. NEUROLOGIC: Alert and Oriented. No focal deficits. PSYCHIATRIC: Cooperative, mood appropriate. SKIN: No rash noted.                 An electronic signature was used to authenticate this note.    --Earlene Plater, MD

## 2022-06-18 LAB — COMPREHENSIVE METABOLIC PANEL
ALT: 85 U/L — ABNORMAL HIGH (ref 0–50)
AST: 47 U/L (ref 0–50)
Albumin/Globulin Ratio: 1.4 (ref 1.00–2.70)
Albumin: 4.3 g/dL (ref 3.5–5.2)
Alk Phosphatase: 74 U/L (ref 40–130)
Anion Gap: 10 mmol/L (ref 2–17)
BUN: 16 mg/dL (ref 6–20)
CO2: 27 mmol/L (ref 22–29)
Calcium: 10.3 mg/dL — ABNORMAL HIGH (ref 8.6–10.0)
Chloride: 100 mmol/L (ref 98–107)
Creatinine: 1.3 mg/dL (ref 0.7–1.3)
Est, Glom Filt Rate: 70 mL/min/1.73m (ref >=60)
Globulin: 3 g/dL (ref 1.9–4.4)
Glucose: 101 mg/dL — ABNORMAL HIGH (ref 70–99)
OSMOLALITY CALCULATED: 275 mOsm/kg (ref 270–287)
Potassium: 4.5 mmol/L (ref 3.5–5.3)
Sodium: 137 mmol/L (ref 135–145)
Total Bilirubin: 0.54 mg/dL (ref 0.00–1.20)
Total Protein: 7.3 g/dL (ref 6.4–8.3)

## 2022-06-18 LAB — HEMOGLOBIN A1C
Est. Avg. Glucose, WB: 146
Est. Avg. Glucose-calculated: 161
Hemoglobin A1C: 6.7 % — ABNORMAL HIGH (ref 4.0–6.0)

## 2022-06-18 LAB — T4, FREE: T4 Free: 1.42 ng/dL (ref 0.82–1.70)

## 2022-06-18 LAB — LIPID PANEL
Chol/HDL Ratio: 8.2 — ABNORMAL HIGH (ref 0.0–4.4)
Cholesterol: 212 mg/dL — ABNORMAL HIGH (ref 100–200)
HDL: 26 mg/dL — ABNORMAL LOW (ref >=40)
LDL Cholesterol: 156.2 mg/dL — ABNORMAL HIGH (ref 0.0–100.0)
LDL/HDL Ratio: 6
Triglycerides: 149 mg/dL (ref 0–149)
VLDL: 29.8 mg/dL (ref 5.0–40.0)

## 2022-06-18 LAB — TSH WITH REFLEX: TSH: 4.44 mcIU/mL — ABNORMAL HIGH (ref 0.358–3.740)

## 2022-06-24 MED ORDER — METFORMIN HCL ER 500 MG PO TB24
500 MG | ORAL_TABLET | Freq: Every day | ORAL | 1 refills | Status: DC
Start: 2022-06-24 — End: 2023-12-07

## 2022-06-24 NOTE — Addendum Note (Signed)
Addended by: Earlene Plater on: 06/24/2022 01:37 PM     Modules accepted: Orders

## 2022-07-21 NOTE — Telephone Encounter (Signed)
losartan (COZAAR) 100 MG tablet  Once daily    amLODIPine (NORVASC) 10 MG tablet  Once daily    60 daily supply    No medication changes    [id number]    Lov 06/17/22  Nov 09/23/22    OUT OF MEDICATION

## 2022-07-25 MED ORDER — AMLODIPINE BESYLATE 10 MG PO TABS
10 MG | ORAL_TABLET | ORAL | 2 refills | Status: AC
Start: 2022-07-25 — End: 2022-10-16

## 2022-07-25 MED ORDER — LOSARTAN POTASSIUM 100 MG PO TABS
100 MG | ORAL_TABLET | ORAL | 2 refills | Status: AC
Start: 2022-07-25 — End: 2022-10-16

## 2022-07-30 DIAGNOSIS — I10 Essential (primary) hypertension: Secondary | ICD-10-CM

## 2022-07-30 DIAGNOSIS — R0683 Snoring: Secondary | ICD-10-CM

## 2022-07-31 ENCOUNTER — Inpatient Hospital Stay
Admit: 2022-07-31 | Discharge: 2022-07-31 | Payer: PRIVATE HEALTH INSURANCE | Attending: Family Medicine | Primary: Family Medicine

## 2022-08-01 ENCOUNTER — Telehealth

## 2022-08-01 NOTE — Telephone Encounter (Signed)
Patient is calling and requesting a referral for an eye doctor. Please let patient know when the referral is in    Please assist

## 2022-08-02 NOTE — Telephone Encounter (Signed)
Referral placed

## 2022-08-03 ENCOUNTER — Telehealth

## 2022-08-03 NOTE — Telephone Encounter (Signed)
See sleep lab results under media and advise

## 2022-08-03 NOTE — Telephone Encounter (Signed)
Patient requesting a prescription for BiPAP machine due to the results of his sleep study.    He does not have a particular medical supply company in mind.  Please advise.

## 2022-08-04 NOTE — Telephone Encounter (Signed)
Patient notified on results and referral placed.  Order for bipap machine faxed to [facility] along with sleep study,notes and demographics

## 2022-09-06 LAB — HM DIABETES EYE EXAM: Diabetic Retinopathy: NEGATIVE

## 2022-09-23 ENCOUNTER — Ambulatory Visit
Admit: 2022-09-23 | Discharge: 2022-09-23 | Payer: PRIVATE HEALTH INSURANCE | Attending: Family Medicine | Primary: Family Medicine

## 2022-09-23 DIAGNOSIS — G4733 Obstructive sleep apnea (adult) (pediatric): Secondary | ICD-10-CM

## 2022-09-23 LAB — TSH WITH REFLEX: TSH: 4.25 mcIU/mL — ABNORMAL HIGH (ref 0.358–3.740)

## 2022-09-23 LAB — HEMOGLOBIN A1C
Est. Avg. Glucose, WB: 131
Est. Avg. Glucose-calculated: 143
Hemoglobin A1C: 6.2 % — ABNORMAL HIGH (ref 4.0–6.0)

## 2022-09-23 LAB — BASIC METABOLIC PANEL
Anion Gap: 15 mmol/L (ref 2–17)
BUN: 12 mg/dL (ref 6–20)
CO2: 21 mmol/L — ABNORMAL LOW (ref 22–29)
Calcium: 9.7 mg/dL (ref 8.5–10.7)
Chloride: 104 mmol/L (ref 98–107)
Creatinine: 1 mg/dL (ref 0.7–1.3)
Est, Glom Filt Rate: 95 mL/min/1.73m (ref >=60)
Glucose: 94 mg/dL (ref 70–99)
OSMOLALITY CALCULATED: 279 mOsm/kg (ref 270–287)
Potassium: 4 mmol/L (ref 3.5–5.3)
Sodium: 140 mmol/L (ref 135–145)

## 2022-09-23 LAB — T4, FREE: T4 Free: 1.13 ng/dL (ref 0.82–1.70)

## 2022-09-23 NOTE — Progress Notes (Signed)
Chief Complaint:     Follow-up (3 month/Lots of fatigue with medicine and dozing off)       ASSESSMENT/PLAN:    ICD-10-CM    1. Obstructive sleep apnea syndrome Stable G47.33       2. Type 2 diabetes mellitus without complication, without long-term current use of insulin (HCC) Stable E11.9 Hemoglobin A1C     Basic Metabolic Panel     TSH with Reflex      3. Essential (primary) hypertension Inadequately Controlled I10 Hemoglobin A1C     Basic Metabolic Panel     TSH with Reflex      4. Class 2 obesity due to excess calories without serious comorbidity with body mass index (BMI) of 39.0 to 39.9 in adult Stable E66.09 TSH with Reflex    Z68.39       5. Borderline abnormal TFTs  R94.6 TSH with Reflex      6. BiPAP (biphasic positive airway pressure) dependence  Z99.89         Patient doing well. Blood pressure elevated-- discusssed need for med compliance. . Check routine labs. Continue current medications. Encouraged healthy eating and physical activity efforts. Patient given phone number to call for appointment with sleep med.     Discussed possible trial of nuvigil or provigil for sleepiness related to OSA, but needs controlled blood pressure.         Return in about 2 weeks (around 10/07/2022).         Subjective   SUBJECTIVE/OBJECTIVE:  Patient with past medical history of hypertension, diabetes, OSA on BIPAP here for follow-up.     Notes he has not been routinely taking medications.       Has been fatigued. Has been using BIPAP.         ROS as per HPI or otherwise negative.           Objective   Vitals:    09/23/22 0909   BP: (!) 152/102   Pulse: 73   SpO2: 97%       GENERAL: The patient is in no apparent distress. Alert and oriented. Vital Signs Reviewed HEENT: Head is normocephalic and atraumatic. Extraocular muscles are intact. Pupils are equal, Conjunctiva normal. Moist Mucous membranes. Posterior pharynx clear of any exudate or lesions. NECK: Supple. No Lymphadenopathy LUNGS: Clear to auscultation bilaterally.  Breath Sounds equal. HEART: Regular rate and rhythm without murmur. No edema ABDOMEN: Soft, nontender, and nondistended. . Musculoskeletal: No deformity. Gait WNL. No swelling noted. NEUROLOGIC: Alert and Oriented. No focal deficits. PSYCHIATRIC: Cooperative, mood appropriate. SKIN: No rash noted.                 An electronic signature was used to authenticate this note.    --Gabriel Cirri, MD

## 2022-10-06 ENCOUNTER — Encounter
Admit: 2022-10-06 | Discharge: 2022-10-06 | Payer: PRIVATE HEALTH INSURANCE | Attending: Family Medicine | Primary: Family Medicine

## 2022-10-06 DIAGNOSIS — I10 Essential (primary) hypertension: Secondary | ICD-10-CM

## 2022-10-06 MED ORDER — MODAFINIL 200 MG PO TABS
200 MG | ORAL_TABLET | Freq: Every day | ORAL | 2 refills | Status: AC
Start: 2022-10-06 — End: 2023-01-04

## 2022-10-06 NOTE — Progress Notes (Signed)
Chief Complaint:     Follow-up (2 week)      Assessment & Plan   ASSESSMENT/PLAN:    ICD-10-CM    1. Essential (primary) hypertension Controlled I10     Controlled on recheck and on recent home readings.      2. Type 2 diabetes mellitus without complication, without long-term current use of insulin (HCC) Stable E11.9     A1c 6.2 in April 2024.      3. BiPAP (biphasic positive airway pressure) dependence  Z99.89 modafinil (PROVIGIL) 200 MG tablet      4. Daytime sleepiness Inadequately Controlled R40.0 modafinil (PROVIGIL) 200 MG tablet      5. Obstructive sleep apnea syndrome  G47.33 modafinil (PROVIGIL) 200 MG tablet    Reports more compliance with Bipap. Has seen sleep med. Still having daytime sleepiness-- discussed trial of provigil. Discussed possible side effects.         Blood pressure controlled on recheck. Discussed trial of provigil for daytimes sleepiness--- also discussed need for Bipap compliance. He will monitor home blood pressure and seek re-eval if blood pressure is consistently > 140/90.  Continue current medications, otherwise. Encouraged healthy eating and physical activity efforts. Keep specialist appointments as planned-- now seeing sleep med.       Return in about 4 weeks (around 11/03/2022).         Subjective   SUBJECTIVE/OBJECTIVE:    Patient with past medical history of hypertension, diabetes, OSA on BIPAP here for follow-up of uncontrolled hypertension and daytime sleepiness.     Notes he has been routinely taking medications since last visit.       Has been using BIPAP. Has seens sleep med since last visit.          ROS as per HPI or otherwise negative.           Objective   Vitals:    10/06/22 1128   BP: 138/74   Pulse:    SpO2:        GENERAL: The patient is in no apparent distress. Alert and oriented. HEENT: Head is normocephalic and atraumatic. Extraocular muscles are intact. Pupils are equal, Conjunctiva normal.  NECK: Supple. No Lymphadenopathy LUNGS: Clear to auscultation  bilaterally. Breath Sounds equal. HEART: Regular rate and rhythm without murmur. No edema ABDOMEN: Soft, nontender, and nondistended. . Musculoskeletal: No deformity. Gait WNL. No swelling noted. NEUROLOGIC: Alert and Oriented. No focal deficits. PSYCHIATRIC: Cooperative, mood appropriate. SKIN: No rash noted.                 An electronic signature was used to authenticate this note.    --Gabriel Cirri, MD

## 2022-10-17 MED ORDER — AMLODIPINE BESYLATE 10 MG PO TABS
10 MG | ORAL_TABLET | ORAL | 1 refills | Status: DC
Start: 2022-10-17 — End: 2023-12-07

## 2022-10-17 MED ORDER — LOSARTAN POTASSIUM 100 MG PO TABS
100 MG | ORAL_TABLET | ORAL | 1 refills | Status: DC
Start: 2022-10-17 — End: 2023-12-07

## 2022-11-08 NOTE — Telephone Encounter (Signed)
No alternatives given.

## 2022-11-10 NOTE — Telephone Encounter (Signed)
LVM for pt to call back.

## 2022-11-28 ENCOUNTER — Encounter: Payer: PRIVATE HEALTH INSURANCE | Attending: Family Medicine | Primary: Family Medicine

## 2022-12-19 NOTE — Telephone Encounter (Signed)
PA Denied for the Modafinil 200 mg, pt to use Good rx coupon

## 2023-01-16 ENCOUNTER — Encounter: Payer: PRIVATE HEALTH INSURANCE | Attending: Family Medicine | Primary: Family Medicine

## 2023-02-21 ENCOUNTER — Encounter: Payer: PRIVATE HEALTH INSURANCE | Attending: Family Medicine | Primary: Family Medicine

## 2023-03-17 ENCOUNTER — Encounter: Payer: PRIVATE HEALTH INSURANCE | Attending: Family Medicine | Primary: Family Medicine

## 2023-09-14 NOTE — Telephone Encounter (Signed)
 Patient needs the following medications:    albuterol sulfate HFA (PROVENTIL;VENTOLIN;PROAIR) 108 (90 Base) MCG/ACT inhaler  2-4 puff as needed Inhalation every 4 hrs for 30 days     Pharmacy  [id number] [place], [place] - [address] [phone] F [phone]    60 day supply  No changes to medication    Lov 10-06-22    Nov none

## 2023-09-15 NOTE — Telephone Encounter (Signed)
 Over due for follow up appt needs to be seen in office pls reach out to schedule

## 2023-10-31 MED ORDER — ALBUTEROL SULFATE HFA 108 (90 BASE) MCG/ACT IN AERS
108 | RESPIRATORY_TRACT | 0 refills | 25.00000 days | Status: DC
Start: 2023-10-31 — End: 2023-12-07

## 2023-12-07 ENCOUNTER — Ambulatory Visit
Admit: 2023-12-07 | Discharge: 2023-12-07 | Payer: PRIVATE HEALTH INSURANCE | Attending: Family Medicine | Primary: Family Medicine

## 2023-12-07 VITALS — BP 150/92 | HR 67 | Ht 65.0 in | Wt 238.0 lb

## 2023-12-07 DIAGNOSIS — E119 Type 2 diabetes mellitus without complications: Principal | ICD-10-CM

## 2023-12-07 MED ORDER — AMLODIPINE BESYLATE 10 MG PO TABS
10 | ORAL_TABLET | ORAL | 1 refills | 90.00000 days | Status: DC
Start: 2023-12-07 — End: 2024-06-27

## 2023-12-07 MED ORDER — FLUTICASONE PROPIONATE 50 MCG/ACT NA SUSP
50 | Freq: Every day | NASAL | 5 refills | 60.00000 days | Status: AC
Start: 2023-12-07 — End: ?

## 2023-12-07 MED ORDER — LOSARTAN POTASSIUM 100 MG PO TABS
100 | ORAL_TABLET | ORAL | 1 refills | 90.00000 days | Status: DC
Start: 2023-12-07 — End: 2024-01-09

## 2023-12-07 MED ORDER — ALBUTEROL SULFATE HFA 108 (90 BASE) MCG/ACT IN AERS
108 | RESPIRATORY_TRACT | 5 refills | 25.00000 days | Status: AC
Start: 2023-12-07 — End: ?

## 2023-12-07 MED ORDER — METFORMIN HCL ER 500 MG PO TB24
500 | ORAL_TABLET | Freq: Every day | ORAL | 1 refills | 90.00000 days | Status: DC
Start: 2023-12-07 — End: 2024-06-27

## 2023-12-07 NOTE — Progress Notes (Signed)
 Chief Complaint:     Follow-up (Due for full labs-needs A1c-has not been taking metformin /DM eye exam-05/2023/Def colon-discuss options/BP elevated/Wants to discuss weight loss option/Gets pain on sides when stretches/Using albuterol  more frequently /BP elevated-has not taken meds today and not taking amlodipine  consistently )      Assessment & Plan   ASSESSMENT/PLAN:    ICD-10-CM    1. Type 2 diabetes mellitus without complication, without long-term current use of insulin (HCC)  E11.9 CBC with Auto Differential     Comprehensive Metabolic Panel     Hemoglobin J8R     TSH reflex to FT4     Albumin/Creatinine Ratio, Urine      2. Essential (primary) hypertension  I10 CBC with Auto Differential     Comprehensive Metabolic Panel     Hemoglobin J8R     TSH reflex to FT4     Albumin/Creatinine Ratio, Urine      3. BiPAP (biphasic positive airway pressure) dependence  Z99.89       4. Obstructive sleep apnea syndrome  G47.33       5. Screening for colon cancer  Z12.11 External Referral To Gastroenterology           Blood pressure elevated. . Discussed need for medication compliance. He will monitor home blood pressure and seek re-eval if blood pressure is consistently > 140/90.  Continue current medications. Discussed possible zepbound due to OSA or mounjaro if diabetes is not well controlled. Check routine labs. Encouraged healthy eating and physical activity efforts. Keep specialist appointments as planned--  seeing sleep med.     Refer for screening colon cancer.       Return in about 3 weeks (around 12/28/2023).         Subjective   SUBJECTIVE/OBJECTIVE:    Patient with past medical history of hypertension, diabetes, OSA on BIPAP here for chronic medical follow-up.      Admits to med non-compliance.        Has been using BIPAP.          ROS as per HPI or otherwise negative.           Objective   Vitals:    12/07/23 0806   BP: (!) 150/92   Pulse: 67   SpO2: 98%       GENERAL: The patient is in no apparent distress. Alert  and oriented. HEENT: Head is normocephalic and atraumatic. Extraocular muscles are intact. Pupils are equal, Conjunctiva normal.  NECK: Supple. No Lymphadenopathy LUNGS: Clear to auscultation bilaterally. Breath Sounds equal. HEART: Regular rate and rhythm without murmur. No edema ABDOMEN: Soft, nontender, and nondistended. . Musculoskeletal: No deformity. Gait WNL. No swelling noted. NEUROLOGIC: Alert and Oriented. No focal deficits. PSYCHIATRIC: Cooperative, mood appropriate. SKIN: No rash noted.                 An electronic signature was used to authenticate this note.    --Lauraine CHRISTELLA Melody, MD

## 2023-12-08 LAB — CBC WITH AUTO DIFFERENTIAL
Basophils %: 0.6 % (ref 0.0–2.0)
Basophils Absolute: 0 10*3/uL (ref 0.0–0.2)
Eosinophils %: 3.2 % (ref 0.0–7.0)
Eosinophils Absolute: 0.2 10*3/uL (ref 0.0–0.5)
Hematocrit: 47.4 % (ref 38.0–52.0)
Hemoglobin: 14.5 g/dL (ref 13.0–17.3)
Immature Grans (Abs): 0.02 10*3/uL (ref 0.00–0.06)
Immature Granulocytes %: 0.3 % (ref 0.0–0.6)
Lymphocytes Absolute: 2.9 10*3/uL (ref 1.0–3.2)
Lymphocytes: 45.9 % — ABNORMAL HIGH (ref 15.0–45.0)
MCH: 22.5 pg — ABNORMAL LOW (ref 27.0–34.5)
MCHC: 30.6 g/dL (ref 30.0–36.0)
MCV: 73.5 fL — ABNORMAL LOW (ref 84.0–100.0)
MPV: 10.9 fL (ref 7.0–12.2)
Monocytes %: 8.3 % (ref 4.0–12.0)
Monocytes Absolute: 0.5 10*3/uL (ref 0.3–1.0)
NRBC Absolute: 0 10*3/uL (ref 0.000–0.012)
NRBC Automated: 0 % (ref 0.0–0.2)
Neutrophils %: 41.7 % — ABNORMAL LOW (ref 42.0–74.0)
Neutrophils Absolute: 2.6 10*3/uL (ref 1.6–7.3)
Platelets: 241 10*3/uL (ref 140–440)
RBC: 6.45 x10e6/mcL — ABNORMAL HIGH (ref 4.00–5.60)
RDW: 18.8 % — ABNORMAL HIGH (ref 10.0–17.0)
WBC: 6.3 10*3/uL (ref 3.8–10.6)

## 2023-12-08 LAB — COMPREHENSIVE METABOLIC PANEL
ALT: 60 U/L — ABNORMAL HIGH (ref 0–42)
AST: 41 U/L (ref 0–46)
Albumin/Globulin Ratio: 1.7 (ref 1.00–2.70)
Albumin: 4.3 g/dL (ref 3.5–5.2)
Alk Phosphatase: 72 U/L (ref 40–130)
Anion Gap: 10 mmol/L (ref 2–17)
BUN: 12 mg/dL (ref 6–20)
CO2: 25 mmol/L (ref 22–29)
Calcium: 9.5 mg/dL (ref 8.5–10.7)
Chloride: 107 mmol/L (ref 98–107)
Creatinine: 1 mg/dL (ref 0.7–1.3)
Est, Glom Filt Rate: 95 mL/min/1.73mÂ² (ref >=60)
Globulin: 2.6 g/dL (ref 1.9–4.4)
Glucose: 106 mg/dL — ABNORMAL HIGH (ref 70–99)
Osmolaliy Calculated: 283 mosm/kg (ref 270–287)
Potassium: 4.2 mmol/L (ref 3.5–5.3)
Sodium: 142 mmol/L (ref 135–145)
Total Bilirubin: 0.27 mg/dL (ref 0.00–1.20)
Total Protein: 6.9 g/dL (ref 5.7–8.3)

## 2023-12-08 LAB — HEMOGLOBIN A1C
Estimated Avg Glucose: 148
Estimated Avg Glucose: 165
Hemoglobin A1C: 6.8 % — ABNORMAL HIGH (ref 4.0–6.0)

## 2023-12-08 LAB — ALBUMIN/CREATININE RATIO, URINE
Albumin Urine: 50.9 mg/dL — ABNORMAL HIGH (ref 0.0–20.0)
Albumin/Creatinine Ratio: 211 mg/g{creat} — ABNORMAL HIGH (ref 0–30)
Creatinine, Ur: 241 mg/dL (ref 39.0–259.0)

## 2023-12-08 LAB — TSH REFLEX TO FT4: TSH: 2.19 u[IU]/mL (ref 0.358–3.740)

## 2023-12-25 ENCOUNTER — Encounter: Payer: PRIVATE HEALTH INSURANCE | Attending: Family Medicine | Primary: Family Medicine

## 2024-01-05 ENCOUNTER — Inpatient Hospital Stay
Admit: 2024-01-05 | Discharge: 2024-01-09 | Disposition: A | Discharge status: Home Health | Payer: PRIVATE HEALTH INSURANCE | Source: Other Acute Inpatient Hospital | Attending: Student in an Organized Health Care Education/Training Program | Admitting: Student in an Organized Health Care Education/Training Program

## 2024-01-05 ENCOUNTER — Emergency Department: Admit: 2024-01-05 | Payer: PRIVATE HEALTH INSURANCE | Primary: Family Medicine

## 2024-01-05 DIAGNOSIS — A419 Sepsis, unspecified organism: Principal | ICD-10-CM

## 2024-01-05 LAB — CBC WITH AUTO DIFFERENTIAL
Basophils %: 0.1 % (ref 0.0–2.0)
Basophils %: 0.1 % (ref 0.0–2.0)
Basophils Absolute: 0 x10e3/mcL (ref 0.0–0.2)
Basophils Absolute: 0 x10e3/mcL (ref 0.0–0.2)
Eosinophils %: 0 % (ref 0.0–7.0)
Eosinophils %: 0 % (ref 0.0–7.0)
Eosinophils Absolute: 0 x10e3/mcL (ref 0.0–0.5)
Eosinophils Absolute: 0 x10e3/mcL (ref 0.0–0.5)
Hematocrit: 44.2 % (ref 38.0–52.0)
Hematocrit: 40.7 % (ref 38.0–52.0)
Hemoglobin: 14 g/dL (ref 13.0–17.3)
Hemoglobin: 13 g/dL (ref 13.0–17.3)
Immature Grans (Abs): 0.08 x10e3/mcL — ABNORMAL HIGH (ref 0.00–0.06)
Immature Grans (Abs): 0.02 x10e3/mcL (ref 0.00–0.06)
Immature Granulocytes %: 0.8 % — ABNORMAL HIGH (ref 0.0–0.6)
Immature Granulocytes %: 0.2 % (ref 0.0–0.6)
Lymphocytes Absolute: 0.8 x10e3/mcL — ABNORMAL LOW (ref 1.0–3.2)
Lymphocytes Absolute: 1.5 x10e3/mcL (ref 1.0–3.2)
Lymphocytes: 8.2 % — ABNORMAL LOW (ref 15.0–45.0)
Lymphocytes: 17.3 % (ref 15.0–45.0)
MCH: 23.1 pg — ABNORMAL LOW (ref 27.0–34.5)
MCH: 23.5 pg — ABNORMAL LOW (ref 27.0–34.5)
MCHC: 31.7 g/dL (ref 30.0–36.0)
MCHC: 31.9 g/dL (ref 30.0–36.0)
MCV: 73.1 fL — ABNORMAL LOW (ref 84.0–100.0)
MCV: 73.5 fL — ABNORMAL LOW (ref 84.0–100.0)
MPV: 9.8 fL (ref 7.0–12.2)
MPV: 10.3 fL (ref 7.0–12.2)
Monocytes %: 5.8 % (ref 4.0–12.0)
Monocytes %: 5.5 % (ref 4.0–12.0)
Monocytes Absolute: 0.6 x10e3/mcL (ref 0.3–1.0)
Monocytes Absolute: 0.5 x10e3/mcL (ref 0.3–1.0)
NRBC Absolute: 0 x10e3/mcL (ref 0.000–0.012)
NRBC Automated: 0 % (ref 0.0–0.2)
Neutrophils %: 85.1 % — ABNORMAL HIGH (ref 42.0–74.0)
Neutrophils %: 76.9 % — ABNORMAL HIGH (ref 42.0–74.0)
Neutrophils Absolute: 8.5 x10e3/mcL — ABNORMAL HIGH (ref 1.6–7.3)
Neutrophils Absolute: 6.6 x10e3/mcL (ref 1.6–7.3)
Platelets: 224 x10e3/mcL (ref 140–440)
Platelets: 226 x10e3/mcL (ref 140–440)
RBC: 6.05 x10e6/mcL — ABNORMAL HIGH (ref 4.00–5.60)
RBC: 5.54 x10e6/mcL (ref 4.00–5.60)
RDW: 15.9 % (ref 10.0–17.0)
RDW: 15.9 % (ref 10.0–17.0)
WBC: 10 x10e3/mcL (ref 3.8–10.6)
WBC: 8.6 x10e3/mcL (ref 3.8–10.6)

## 2024-01-05 LAB — COMPREHENSIVE METABOLIC PANEL
ALT: 39 U/L (ref 0–42)
ALT: 33 U/L (ref 0–42)
AST: 28 U/L (ref 0–46)
AST: 22 U/L (ref 0–46)
Albumin/Globulin Ratio: 1.27 (ref 1.00–2.70)
Albumin/Globulin Ratio: 1.5 (ref 1.00–2.70)
Albumin: 4.2 g/dL (ref 3.5–5.2)
Albumin: 4 g/dL (ref 3.5–5.2)
Alk Phosphatase: 69 U/L (ref 40–130)
Alk Phosphatase: 68 U/L (ref 40–130)
Anion Gap: 13 mmol/L (ref 2–17)
Anion Gap: 12 mmol/L (ref 2–17)
BUN: 16 mg/dL (ref 6–20)
BUN: 17 mg/dL (ref 6–20)
CALCIUM,CORRECTED,CCA: 8.9 mg/dL (ref 8.5–10.7)
CO2: 24 mmol/L (ref 22–29)
CO2: 25 mmol/L (ref 22–29)
Calcium: 9.1 mg/dL (ref 8.5–10.7)
Calcium: 8.4 mg/dL — ABNORMAL LOW (ref 8.5–10.7)
Chloride: 99 mmol/L (ref 98–107)
Chloride: 101 mmol/L (ref 98–107)
Creatinine: 1.2 mg/dL (ref 0.7–1.3)
Creatinine: 1.2 mg/dL (ref 0.7–1.3)
Est, Glom Filt Rate: 76 mL/min/1.73mÂ² (ref >=60)
Est, Glom Filt Rate: 76 mL/min/1.73mÂ² (ref >=60)
Globulin: 3.3 g/dL (ref 1.9–4.4)
Globulin: 2.6 g/dL (ref 1.9–4.4)
Glucose: 129 mg/dL — ABNORMAL HIGH (ref 70–99)
Glucose: 114 mg/dL — ABNORMAL HIGH (ref 70–99)
Osmolaliy Calculated: 274 mosm/kg (ref 270–287)
Osmolaliy Calculated: 278 mosm/kg (ref 270–287)
Potassium: 4.2 mmol/L (ref 3.5–5.3)
Potassium: 4.2 mmol/L (ref 3.5–5.3)
Sodium: 136 mmol/L (ref 135–145)
Sodium: 138 mmol/L (ref 135–145)
Total Bilirubin: 0.82 mg/dL (ref 0.00–1.20)
Total Bilirubin: 1.07 mg/dL (ref 0.00–1.20)
Total Protein: 7.5 g/dL (ref 5.7–8.3)
Total Protein: 6.6 g/dL (ref 5.7–8.3)

## 2024-01-05 LAB — URINALYSIS W/ RFLX MICROSCOPIC
Bilirubin, Urine: NEGATIVE
Glucose, Ur: NEGATIVE
Ketones, Urine: NEGATIVE
Leukocyte Esterase, Urine: NEGATIVE
Nitrite, Urine: NEGATIVE
Protein, UA: 30 — AB
Specific Gravity, UA: 1.02 (ref 1.003–1.035)
Urobilinogen, Urine: 0.2 EU/dL (ref 0.2–1.0)
pH, Urine: 6 (ref 4.5–8.0)

## 2024-01-05 LAB — PROTIME-INR
INR: 1.1 — ABNORMAL LOW (ref 1.5–3.5)
Protime: 14.5 s (ref 11.6–14.5)

## 2024-01-05 LAB — POCT GLUCOSE
POC Glucose: 108 mg/dL (ref 65.0–110.0)
POC Glucose: 126 mg/dL — ABNORMAL HIGH (ref 65.0–110.0)

## 2024-01-05 LAB — MICROSCOPIC URINALYSIS: Hyaline Casts, UA: NONE SEEN /LPF

## 2024-01-05 LAB — LACTATE, SEPSIS
Lactate, Sepsis: 1.6 mmol/L (ref 0.5–2.0)
Lactate, Sepsis: 1 mmol/L (ref 0.5–2.0)

## 2024-01-05 LAB — HEMOGLOBIN A1C
Estimated Avg Glucose: 148
Estimated Avg Glucose: 165
Hemoglobin A1C: 6.8 % — ABNORMAL HIGH (ref 4.0–6.0)

## 2024-01-05 LAB — LIPASE: Lipase: 23 U/L (ref 13–60)

## 2024-01-05 LAB — ABO/RH: ABO/Rh: B POS

## 2024-01-05 LAB — MAGNESIUM: Magnesium: 2.2 mg/dL (ref 1.6–2.6)

## 2024-01-05 LAB — LACTIC ACID: Lactic Acid: 1 mmol/L (ref 0.5–2.0)

## 2024-01-05 LAB — ANTIBODY SCREEN: Antibody Screen: NEGATIVE

## 2024-01-05 MED ORDER — LORAZEPAM 0.5 MG PO TABS
0.5 | ORAL | Status: DC | PRN
Start: 2024-01-05 — End: 2024-01-09

## 2024-01-05 MED ORDER — POLYETHYLENE GLYCOL 3350 17 G PO PACK
17 | Freq: Every day | ORAL | Status: DC | PRN
Start: 2024-01-05 — End: 2024-01-05

## 2024-01-05 MED ORDER — PROCHLORPERAZINE EDISYLATE 10 MG/2ML IJ SOLN
10 | Freq: Once | INTRAMUSCULAR | Status: DC | PRN
Start: 2024-01-05 — End: 2024-01-05

## 2024-01-05 MED ORDER — OXYCODONE HCL 5 MG PO TABS
5 | ORAL | Status: DC | PRN
Start: 2024-01-05 — End: 2024-01-05

## 2024-01-05 MED ORDER — SODIUM CHLORIDE 0.9 % IV BOLUS
0.9 | Freq: Once | INTRAVENOUS | Status: AC
Start: 2024-01-05 — End: 2024-01-05
  Administered 2024-01-05: 08:00:00 1000 mL via INTRAVENOUS

## 2024-01-05 MED ORDER — IOPAMIDOL 61 % IV SOLN
61 | Freq: Once | INTRAVENOUS | Status: AC | PRN
Start: 2024-01-05 — End: 2024-01-05
  Administered 2024-01-05: 06:00:00 100 mL via INTRAVENOUS

## 2024-01-05 MED ORDER — LABETALOL HCL 5 MG/ML IV SOLN
5 | INTRAVENOUS | Status: DC | PRN
Start: 2024-01-05 — End: 2024-01-05

## 2024-01-05 MED ORDER — LORAZEPAM 1 MG PO TABS
1 | ORAL | Status: DC | PRN
Start: 2024-01-05 — End: 2024-01-09

## 2024-01-05 MED ORDER — PIPERACILLIN SOD-TAZOBACTAM SO 4.5 (4-0.5) G IV SOLR
4.5 | Freq: Once | INTRAVENOUS | Status: AC
Start: 2024-01-05 — End: 2024-01-05
  Administered 2024-01-05: 07:00:00 4500 mg via INTRAVENOUS

## 2024-01-05 MED ORDER — INSULIN LISPRO 100 UNIT/ML IJ SOLN
100 | Freq: Four times a day (QID) | INTRAMUSCULAR | Status: DC
Start: 2024-01-05 — End: 2024-01-06

## 2024-01-05 MED ORDER — LIDOCAINE HCL (PF) 2 % IJ SOLN
2 | INTRAMUSCULAR | Status: AC
Start: 2024-01-05 — End: ?

## 2024-01-05 MED ORDER — PHENYLEPHRINE HCL 1 MG/10ML IV SOSY
1 | Freq: Once | INTRAVENOUS | Status: DC | PRN
Start: 2024-01-05 — End: 2024-01-05
  Administered 2024-01-05 (×4): 200 via INTRAVENOUS

## 2024-01-05 MED ORDER — LACTATED RINGERS IV SOLN
INTRAVENOUS | Status: DC | PRN
Start: 2024-01-05 — End: 2024-01-05
  Administered 2024-01-05 (×2): via INTRAVENOUS

## 2024-01-05 MED ORDER — ONDANSETRON HCL 4 MG/2ML IJ SOLN
4 | INTRAMUSCULAR | Status: AC
Start: 2024-01-05 — End: ?

## 2024-01-05 MED ORDER — ROCURONIUM BROMIDE 50 MG/5ML IV SOLN
50 | INTRAVENOUS | Status: AC
Start: 2024-01-05 — End: ?

## 2024-01-05 MED ORDER — MAGNESIUM SULFATE 2 GM/50ML IV SOLN
2 | INTRAVENOUS | Status: DC | PRN
Start: 2024-01-05 — End: 2024-01-09

## 2024-01-05 MED ORDER — SUCCINYLCHOLINE CHLORIDE 20 MG/ML IJ SOLN
20 | Freq: Once | INTRAMUSCULAR | Status: DC | PRN
Start: 2024-01-05 — End: 2024-01-05
  Administered 2024-01-05: 19:00:00 160 via INTRAVENOUS

## 2024-01-05 MED ORDER — SUGAMMADEX SODIUM 200 MG/2ML IV SOLN
200 | Freq: Once | INTRAVENOUS | Status: DC | PRN
Start: 2024-01-05 — End: 2024-01-05
  Administered 2024-01-05: 22:00:00 200 via INTRAVENOUS

## 2024-01-05 MED ORDER — LACTATED RINGERS IV SOLN
INTRAVENOUS | Status: DC
Start: 2024-01-05 — End: 2024-01-05

## 2024-01-05 MED ORDER — PROPOFOL 200 MG/20ML IV EMUL
200 | Freq: Once | INTRAVENOUS | Status: DC | PRN
Start: 2024-01-05 — End: 2024-01-05
  Administered 2024-01-05: 19:00:00 180 via INTRAVENOUS

## 2024-01-05 MED ORDER — SODIUM CHLORIDE (PF) 0.9 % IJ SOLN
0.9 | INTRAMUSCULAR | Status: DC | PRN
Start: 2024-01-05 — End: 2024-01-05

## 2024-01-05 MED ORDER — FENTANYL CITRATE (PF) 100 MCG/2ML IJ SOLN
100 | INTRAMUSCULAR | Status: AC
Start: 2024-01-05 — End: ?

## 2024-01-05 MED ORDER — DEXTROSE 10 % IV BOLUS
INTRAVENOUS | Status: DC | PRN
Start: 2024-01-05 — End: 2024-01-09

## 2024-01-05 MED ORDER — HYDROMORPHONE HCL 1 MG/ML IJ SOLN
1 | INTRAMUSCULAR | Status: DC | PRN
Start: 2024-01-05 — End: 2024-01-05

## 2024-01-05 MED ORDER — ACETAMINOPHEN 325 MG PO TABS
325 | Freq: Once | ORAL | Status: AC
Start: 2024-01-05 — End: 2024-01-05
  Administered 2024-01-05: 11:00:00 650 mg via ORAL

## 2024-01-05 MED ORDER — HYDROMORPHONE HCL 1 MG/ML IJ SOLN
1 | INTRAMUSCULAR | Status: AC
Start: 2024-01-05 — End: ?

## 2024-01-05 MED ORDER — HYDRALAZINE HCL 20 MG/ML IJ SOLN
20 | INTRAMUSCULAR | Status: DC | PRN
Start: 2024-01-05 — End: 2024-01-05

## 2024-01-05 MED ORDER — ONDANSETRON HCL 4 MG/2ML IJ SOLN
4 | INTRAMUSCULAR | Status: DC | PRN
Start: 2024-01-05 — End: 2024-01-09

## 2024-01-05 MED ORDER — SODIUM CHLORIDE 0.9 % IV SOLN
0.9 | INTRAVENOUS | Status: DC | PRN
Start: 2024-01-05 — End: 2024-01-05

## 2024-01-05 MED ORDER — LIDOCAINE HCL (PF) 2 % IJ SOLN
2 | Freq: Once | INTRAMUSCULAR | Status: DC | PRN
Start: 2024-01-05 — End: 2024-01-05
  Administered 2024-01-05: 19:00:00 100 via INTRAVENOUS

## 2024-01-05 MED ORDER — BISACODYL 10 MG RE SUPP
10 | Freq: Every day | RECTAL | Status: DC | PRN
Start: 2024-01-05 — End: 2024-01-09

## 2024-01-05 MED ORDER — PROPOFOL 200 MG/20ML IV EMUL
200 | INTRAVENOUS | Status: AC
Start: 2024-01-05 — End: ?

## 2024-01-05 MED ORDER — ACETAMINOPHEN 325 MG PO TABS
325 | Freq: Four times a day (QID) | ORAL | Status: DC | PRN
Start: 2024-01-05 — End: 2024-01-05

## 2024-01-05 MED ORDER — ACETAMINOPHEN 650 MG RE SUPP
650 | Freq: Once | RECTAL | Status: DC
Start: 2024-01-05 — End: 2024-01-05

## 2024-01-05 MED ORDER — NORMAL SALINE FLUSH 0.9 % IV SOLN
0.9 | Freq: Two times a day (BID) | INTRAVENOUS | Status: DC
Start: 2024-01-05 — End: 2024-01-05

## 2024-01-05 MED ORDER — HYDROMORPHONE HCL 1 MG/ML IJ SOLN
1 | INTRAMUSCULAR | Status: DC | PRN
Start: 2024-01-05 — End: 2024-01-05
  Administered 2024-01-05: 10:00:00 1 mg via INTRAVENOUS

## 2024-01-05 MED ORDER — SUGAMMADEX SODIUM 200 MG/2ML IV SOLN
200 | INTRAVENOUS | Status: AC
Start: 2024-01-05 — End: ?

## 2024-01-05 MED ORDER — SODIUM CHLORIDE 0.9 % IV SOLN
0.9 | INTRAVENOUS | Status: AC
Start: 2024-01-05 — End: 2024-01-07
  Administered 2024-01-05 – 2024-01-06 (×3): via INTRAVENOUS

## 2024-01-05 MED ORDER — FENTANYL CITRATE (PF) 100 MCG/2ML IJ SOLN
100 | Freq: Once | INTRAMUSCULAR | Status: DC | PRN
Start: 2024-01-05 — End: 2024-01-05
  Administered 2024-01-05: 19:00:00 100 via INTRAVENOUS

## 2024-01-05 MED ORDER — IPRATROPIUM-ALBUTEROL 0.5-2.5 (3) MG/3ML IN SOLN
0.5-2.5 | Freq: Once | RESPIRATORY_TRACT | Status: DC | PRN
Start: 2024-01-05 — End: 2024-01-05

## 2024-01-05 MED ORDER — ACETAMINOPHEN 325 MG PO TABS
325 | ORAL | Status: DC | PRN
Start: 2024-01-05 — End: 2024-01-09

## 2024-01-05 MED ORDER — GLUCAGON EMERGENCY 1 MG/ML IJ SOLR
1 | INTRAMUSCULAR | Status: DC | PRN
Start: 2024-01-05 — End: 2024-01-09

## 2024-01-05 MED ORDER — ALUM & MAG HYDROXIDE-SIMETH 200-200-20 MG/5ML PO SUSP
200-200-20 | Freq: Four times a day (QID) | ORAL | Status: DC | PRN
Start: 2024-01-05 — End: 2024-01-09

## 2024-01-05 MED ORDER — MIDAZOLAM HCL 2 MG/2ML IJ SOLN
2 | INTRAMUSCULAR | Status: AC
Start: 2024-01-05 — End: ?

## 2024-01-05 MED ORDER — DEXTROSE 10 % IV SOLN
10 | INTRAVENOUS | Status: DC | PRN
Start: 2024-01-05 — End: 2024-01-09

## 2024-01-05 MED ORDER — DEXAMETHASONE SODIUM PHOSPHATE 4 MG/ML IJ SOLN
4 | INTRAMUSCULAR | Status: AC
Start: 2024-01-05 — End: ?

## 2024-01-05 MED ORDER — HALOPERIDOL LACTATE 5 MG/ML IJ SOLN
5 | Freq: Once | INTRAMUSCULAR | Status: DC | PRN
Start: 2024-01-05 — End: 2024-01-05

## 2024-01-05 MED ORDER — NORMAL SALINE FLUSH 0.9 % IV SOLN
0.9 | Freq: Two times a day (BID) | INTRAVENOUS | Status: DC
Start: 2024-01-05 — End: 2024-01-09
  Administered 2024-01-05 – 2024-01-09 (×7): 10 mL via INTRAVENOUS

## 2024-01-05 MED ORDER — NORMAL SALINE FLUSH 0.9 % IV SOLN
0.9 | Freq: Two times a day (BID) | INTRAVENOUS | Status: DC
Start: 2024-01-05 — End: 2024-01-09
  Administered 2024-01-05 – 2024-01-09 (×6): 10 mL via INTRAVENOUS

## 2024-01-05 MED ORDER — NORMAL SALINE FLUSH 0.9 % IV SOLN
0.9 | INTRAVENOUS | Status: DC | PRN
Start: 2024-01-05 — End: 2024-01-09

## 2024-01-05 MED ORDER — MORPHINE SULFATE (PF) 4 MG/ML IJ SOLN
4 | INTRAMUSCULAR | Status: AC
Start: 2024-01-05 — End: 2024-01-05
  Administered 2024-01-05: 06:00:00 4 mg via INTRAVENOUS

## 2024-01-05 MED ORDER — MIDAZOLAM HCL 2 MG/2ML IJ SOLN
2 | Freq: Once | INTRAMUSCULAR | Status: DC | PRN
Start: 2024-01-05 — End: 2024-01-05
  Administered 2024-01-05: 19:00:00 2 via INTRAVENOUS

## 2024-01-05 MED ORDER — ONDANSETRON HCL 4 MG/2ML IJ SOLN
4 | Freq: Once | INTRAMUSCULAR | Status: DC | PRN
Start: 2024-01-05 — End: 2024-01-05
  Administered 2024-01-05: 22:00:00 4 via INTRAVENOUS

## 2024-01-05 MED ORDER — SUCCINYLCHOLINE CHLORIDE 20 MG/ML IJ SOLN
20 | INTRAMUSCULAR | Status: AC
Start: 2024-01-05 — End: ?

## 2024-01-05 MED ORDER — SODIUM CHLORIDE 0.9 % IV SOLN
0.9 | INTRAVENOUS | Status: DC | PRN
Start: 2024-01-05 — End: 2024-01-09

## 2024-01-05 MED ORDER — NORMAL SALINE FLUSH 0.9 % IV SOLN
0.9 | INTRAVENOUS | Status: DC | PRN
Start: 2024-01-05 — End: 2024-01-05

## 2024-01-05 MED ORDER — ACETAMINOPHEN 500 MG PO TABS
500 | ORAL | Status: AC
Start: 2024-01-05 — End: 2024-01-05
  Administered 2024-01-05: 08:00:00 1000 mg via ORAL

## 2024-01-05 MED ORDER — HYDROMORPHONE HCL PF 2 MG/ML IJ SOLN
2 | INTRAMUSCULAR | Status: AC
Start: 2024-01-05 — End: 2024-01-05
  Administered 2024-01-05: 09:00:00 1 mg via INTRAVENOUS

## 2024-01-05 MED ORDER — ONDANSETRON 4 MG PO TBDP
4 | Freq: Four times a day (QID) | ORAL | Status: DC | PRN
Start: 2024-01-05 — End: 2024-01-09

## 2024-01-05 MED ORDER — DEXAMETHASONE SODIUM PHOSPHATE 4 MG/ML IJ SOLN
4 | Freq: Once | INTRAMUSCULAR | Status: DC | PRN
Start: 2024-01-05 — End: 2024-01-05
  Administered 2024-01-05: 19:00:00 10 via INTRAVENOUS

## 2024-01-05 MED ORDER — EPHEDRINE SULFATE (PRESSORS) 50 MG/ML IV SOLN
50 | Freq: Once | INTRAVENOUS | Status: DC | PRN
Start: 2024-01-05 — End: 2024-01-05
  Administered 2024-01-05: 19:00:00 5 via INTRAVENOUS

## 2024-01-05 MED ORDER — HYDROMORPHONE HCL PF 2 MG/ML IJ SOLN
2 | INTRAMUSCULAR | Status: AC
Start: 2024-01-05 — End: 2024-01-05
  Administered 2024-01-05: 07:00:00 1 mg via INTRAVENOUS

## 2024-01-05 MED ORDER — HYDROMORPHONE HCL 1 MG/ML IJ SOLN
1 | Freq: Once | INTRAMUSCULAR | Status: DC | PRN
Start: 2024-01-05 — End: 2024-01-05
  Administered 2024-01-05 (×2): .5 via INTRAVENOUS

## 2024-01-05 MED ORDER — PIPERACILLIN SOD-TAZOBACTAM SO 4.5 (4-0.5) G IV SOLR
4.5 | Freq: Once | INTRAVENOUS | Status: DC
Start: 2024-01-05 — End: 2024-01-05

## 2024-01-05 MED ORDER — GLUCOSE 4 G PO CHEW
4 | ORAL | Status: DC | PRN
Start: 2024-01-05 — End: 2024-01-09

## 2024-01-05 MED ORDER — SODIUM CHLORIDE 0.9 % IV SOLN (MINI-BAG)
0.9 | Freq: Three times a day (TID) | INTRAVENOUS | Status: DC
Start: 2024-01-05 — End: 2024-01-09
  Administered 2024-01-05 – 2024-01-09 (×13): 4500 mg via INTRAVENOUS

## 2024-01-05 MED ORDER — ONDANSETRON HCL 4 MG/2ML IJ SOLN
4 | Freq: Once | INTRAMUSCULAR | Status: AC
Start: 2024-01-05 — End: 2024-01-05
  Administered 2024-01-05: 06:00:00 4 mg via INTRAVENOUS

## 2024-01-05 MED ORDER — ROCURONIUM BROMIDE 50 MG/5ML IV SOLN
50 | Freq: Once | INTRAVENOUS | Status: DC | PRN
Start: 2024-01-05 — End: 2024-01-05
  Administered 2024-01-05 (×2): 10 via INTRAVENOUS
  Administered 2024-01-05: 19:00:00 30 via INTRAVENOUS
  Administered 2024-01-05: 20:00:00 20 via INTRAVENOUS

## 2024-01-05 MED ORDER — ACETAMINOPHEN 650 MG RE SUPP
650 | Freq: Four times a day (QID) | RECTAL | Status: DC | PRN
Start: 2024-01-05 — End: 2024-01-05

## 2024-01-05 MED FILL — DILAUDID 1 MG/ML IJ SOLN: 1 mg/mL | INTRAMUSCULAR | Qty: 0.5 | Fill #0

## 2024-01-05 MED FILL — MIDAZOLAM HCL 2 MG/2ML IJ SOLN: 2 mg/mL | INTRAMUSCULAR | Qty: 2 | Fill #0

## 2024-01-05 MED FILL — HYDROMORPHONE HCL 2 MG/ML IJ SOLN: 2 mg/mL | INTRAMUSCULAR | Qty: 1 | Fill #0

## 2024-01-05 MED FILL — PIPERACILLIN SOD-TAZOBACTAM SO 4.5 (4-0.5) G IV SOLR: 4.5 (4-0.5) g | INTRAVENOUS | Qty: 4500 | Fill #0

## 2024-01-05 MED FILL — PROPOFOL 200 MG/20ML IV EMUL: 200 MG/20ML | INTRAVENOUS | Qty: 20 | Fill #0

## 2024-01-05 MED FILL — ONDANSETRON HCL 4 MG/2ML IJ SOLN: 4 MG/2ML | INTRAMUSCULAR | Qty: 2 | Fill #0

## 2024-01-05 MED FILL — DEXAMETHASONE SODIUM PHOSPHATE 4 MG/ML IJ SOLN: 4 mg/mL | INTRAMUSCULAR | Qty: 2 | Fill #0

## 2024-01-05 MED FILL — NORMAL SALINE FLUSH 0.9 % IV SOLN: 0.9 % | INTRAVENOUS | Qty: 20 | Fill #0

## 2024-01-05 MED FILL — ANECTINE 20 MG/ML IJ SOLN: 20 mg/mL | INTRAMUSCULAR | Qty: 10 | Fill #0

## 2024-01-05 MED FILL — BRIDION 200 MG/2ML IV SOLN: 200 MG/2ML | INTRAVENOUS | Qty: 2 | Fill #0

## 2024-01-05 MED FILL — FENTANYL CITRATE (PF) 100 MCG/2ML IJ SOLN: 100 MCG/2ML | INTRAMUSCULAR | Qty: 2 | Fill #0

## 2024-01-05 MED FILL — XYLOCAINE-MPF 2 % IJ SOLN: 2 % | INTRAMUSCULAR | Qty: 5 | Fill #0

## 2024-01-05 MED FILL — TYLENOL 325 MG PO TABS: 325 mg | ORAL | Qty: 2 | Fill #0

## 2024-01-05 MED FILL — ROCURONIUM BROMIDE 50 MG/5ML IV SOLN: 50 MG/5ML | INTRAVENOUS | Qty: 5 | Fill #0

## 2024-01-05 MED FILL — ACETAMINOPHEN EXTRA STRENGTH 500 MG PO TABS: 500 mg | ORAL | Qty: 2 | Fill #0

## 2024-01-05 MED FILL — DILAUDID 1 MG/ML IJ SOLN: 1 mg/mL | INTRAMUSCULAR | Qty: 1 | Fill #0

## 2024-01-05 MED FILL — MORPHINE SULFATE (PF) 4 MG/ML IJ SOLN: 4 mg/mL | INTRAMUSCULAR | Qty: 1 | Fill #0

## 2024-01-05 NOTE — Consults (Signed)
 Please see consult note written by Tinnie Pilon at 10:27 AM on 01/05/2024.  This is an attestation for that note.    I saw and examined Frank Harris today in the preoperative area.  He has been having abdominal pain for the past day.  History significant for obesity with a BMI of 42.9 kg/m, and type 2 diabetes and hypertension.  No previous abdominal surgery.  He is accompanied by his family today, including his wife [place].  Person reviewed his chart including his labs and his CT images.  His CT shows a thickened sigmoid colon with perforated diverticulitis and a fair amount of pneumoperitoneum that appears loculated in the mid abdomen.  On exam he has diffuse peritonitis.  He had a fever last night.  He has been on IV antibiotics.  I talked to the patient and his family in great detail about the need to proceed with operative intervention at this time.  I recommend laparoscopic, possible open sigmoidectomy, with possible primary anastomosis and diverting loop ileostomy versus Hartman's procedure with sigmoidectomy and end colostomy.  I talked with him in detail about the postoperative expectations and the risks associated with surgery and they agreed to proceed and signed the written consent.

## 2024-01-05 NOTE — Op Note (Signed)
 Operative Note      Patient: Frank Harris  Date of Birth: 12-Nov-1980  MRN: [id number]    Date of Procedure: 2024-02-01    Pre-Op Diagnosis Codes:      Perforated sigmoid diverticulitis    Post-Op Diagnosis: Same       Procedure(s):  Diagnostic laparoscopy  Laparoscopic drainage of intraabdominal abscess  Laparoscopic sigmoid colon mobilization  Exploratory laparotomy  Sigmoid colectomy with colorectal anastomosis  Creation of diverting loop ileostomy    Surgeon(s):  Fransheska Willingham, Donnajean HERO, MD    Assistant:   Physician Assistant: Gena Tinnie SAILOR, PA-C's assistance was necessary or laparoscopic setup, retraction/exposure, expert tissue handling, and expert camera/instrument manipulation and was present for the entirety of the case.      Anesthesia: General    Estimated Blood Loss (mL): Minimal    Complications: None    Specimens:   ID Type Source Tests Collected by Time Destination   A : Sigmoid Colon and Anastomotic Rings Tissue Sigmoid Colon SURGICAL PATHOLOGY Sareen Randon, Donnajean HERO, MD February 01, 2024 1616        Implants:  * No implants in log *      Drains:   Closed/Suction Drain Lateral LLQ Bulb (Active)       Urinary Catheter Feb 01, 2024 2 Way (Active)       Findings:  Present At Time Of Surgery (PATOS) (choose all levels that have infection present):  - Organ Space infection (below fascia) present as evidenced by feculent peritonitis  Other Findings: Perforated sigmoid diverticulitis with centimeter segment of severe inflammation and 2 cm perforation, with purulent peritonitis.  The entire operation was made difficult by his body habitus of 43 kg/m.  Stapled EEA colorectal anastomosis was done with fecal diversion with loop ileostomy creation.  Modifier 22 requested for this case due to extra time spent with mobilization and visualization which were extremely difficult given his body habitus, shortened mesentery, and degree of intra-abdominal fat.  He had an interloop abscess within the pelvis that was surrounded by  small intestine which we drained laparoscopically.  He also had very dilated small bowel which precluded total laparoscopic case.    Detailed Description of Procedure:   Patient was brought to the operating room.  General ET tube anesthesia was induced.  Was placed in the supine position.  Preoperative antibiotics were administered.  His abdomen was prepped and draped in sterile fashion and he was placed in low lithotomy position. surgical timeout was performed.  Entrance to the abdominal cavity was obtained via Optiview technique using a 5 mm trocar in the right upper quadrant.  The abdomen was entered without injury to underlying structures.  The abdomen was insufflated to 15 mmHg.  2 additional 5 mm trocars were placed, 1 at the umbilicus and 1 in the right lower quadrant.  Had purulent peritonitis.  The small bowel was dilated and injected as well.  There was an interloop abscess which I broke apart and suctioned purulence free from this.  This revealed the sigmoid perforation.  He had a large amount of intra-abdominal fat and a BMI of 43, making this entire operation more difficult due to his high BMI.  The sigmoid was enlarged and inflamed and very difficult to visualize due to his body habitus.  I began to mobilize the sigmoid medially by incising the white line of Toldt using the LigaSure.  The sigmoid was mobilized medially a good deal.  Due to the difficulty in visualizing the sigmoid mesentery due to the dilated  small bowel and his foreshortened mesentery, I converted to a small midline laparotomy incision.  A large Alexis wound protector was placed.  I was able to eventually bring up the area of perforation into the wound which revealed a long and healthy rectal stump.  Portion of sigmoid that was inflamed was about 10 cm long.  I came across the descending colon using a purple load of the 60 mm GIA stapler.  Using the LigaSure I ligated the colon mesentery all the way down to the sigmoid rectal  junction.  Using a contour stapler I transected the distal sigmoid.  The specimen was placed off the field and sent for surgical pathology.  Hemostasis was achieved with electrocautery.  The abdomen was irrigated with multiple liters of sterile saline and suctioned free.  The mobilized piece of descending colon I saw that this easily reached into the pelvis and I decided to create a primary anastomosis.  I used a 28 mm purple Tri stapler EEA stapler to create this anastomosis.  There was excellent blood flow and no tension on this.  I oversewed the anterior portion of the staple line with multiple interrupted Vicryl Lembert sutures.  Placed a 65 French round Blake drain through the left lower quadrant.  I created a diverting loop ileostomy site in the right lower quadrant.  It was extremely difficult to pull this loop through the abdomen due to his foreshortened and fatty mesentery and his extremely thick abdominal wall which was about 8 cm in thickness.  Once this was done we all changed into sterile set of gown and gloves.  The midline fascia was closed with 2 #1 non-looped PDS sutures.  Skin and incisions were closed with the skin stapler.  The ileostomy was then matured in standard [place] fashion and an appliance was applied.  Sterile dressings were placed.  The patient awoke from anesthesia and was extubated and brought to PACU.  Again, this case easily took at least 4-5 times longer than normal due to his high BMI and difficulty in visualization.  I request a 22 modifier.    Electronically signed by Donnajean CHRISTELLA Griffes, MD on 01/05/2024 at 6:24 PM

## 2024-01-05 NOTE — Anesthesia Procedure Notes (Signed)
 Airway  Date/Time: 01/05/2024 2:48 PM  Urgency: elective    Airway not difficult    General Information and Staff    Patient location during procedure: OR  Anesthesiologist: Nan Marsa DEL, MD  Performed: anesthesiologist   Performed by: Nan Marsa DEL, MD  Authorized by: Nan Marsa DEL, MD      Indications and Patient Condition  Indications for airway management: anesthesia  Sedation level: deep  Preoxygenated: yes  Patient position: sniffing  Mask difficulty assessment: not attempted    Final Airway Details  Final airway type: endotracheal airway      Successful airway: ETT  Cuffed: yes   Successful intubation technique: direct laryngoscopy  Endotracheal tube insertion site: oral  Blade: Macintosh  Blade size: #4  ETT size (mm): 7.0  Cormack-Lehane Classification: grade IIa - partial view of glottis  Placement verified by: capnometry   Cuff volume (mL): 8  Measured from: teeth  ETT to teeth (cm): 22  Number of attempts at approach: 1  Ventilation between attempts: bag mask    Additional Comments  ASA monitors applied. Patient preoxygenated. IV induction. Eyes taped. RSI. Direct laryngoscopy, 1 attempt. Easy, atraumatic intubation with oral ETT. Positive ETCO2 detected, ETT secured. Patient positioned, head & neck supported. All pressure points padded.  no

## 2024-01-05 NOTE — ED Provider Notes (Signed)
 [facility] EMERGENCY DEPARTMENT  EMERGENCY DEPARTMENT ENCOUNTER      Pt Name: Frank Harris  MRN: [id number]  Birthdate 1980/07/02  Date of evaluation: 01/05/2024  Provider: Prentice GORMAN Helper, MD    CHIEF COMPLAINT       Chief Complaint   Patient presents with    Abdominal Pain     Pt c/o lower abd pain x3days but worsened over the past day, enodrses nausea w/ pain, currently denies v/d/fever. LBM 7/2. Denies GI hx. A&Ox4, in NAD.        HISTORY OF PRESENT ILLNESS      YVON MCCORD is a 43 y.o. male who presents to the emergency department due to abdominal pain.  The patient complains of lower abdominal pain for the last 3 days.  Pain is worsened over the last day and now encompasses the entire abdominal area.  He now has some nausea without vomiting.  States his last bowel movement was 3 days prior.  No history of small bowel obstruction or prior abdominal surgeries.  He does have a history of diabetes and hypertension.  Occasional alcohol use.  No other drugs.    HPI    Nursing Notes were reviewed.  REVIEW OF SYSTEMS     Review of Systems    Except as noted above the remainder of the review of systems was reviewed and negative.     PAST MEDICAL HISTORY     Past Medical History:   Diagnosis Date    Microcytic anemia     w/ elevated red blood cell mass    Sarcoidosis      SURGICAL HISTORY       Past Surgical History:   Procedure Laterality Date    FRACTURE SURGERY Right     leg     CURRENT MEDICATIONS       Previous Medications    ACETAMINOPHEN  (TYLENOL ) 650 MG EXTENDED RELEASE TABLET    Take 1 tablet by mouth every 8 hours as needed    ALBUTEROL  SULFATE HFA (PROVENTIL ;VENTOLIN ;PROAIR ) 108 (90 BASE) MCG/ACT INHALER    2-4 puff as needed Inhalation every 4 hrs for 30 days    AMLODIPINE  (NORVASC ) 10 MG TABLET    TAKE 1 TABLET BY MOUTH EVERY DAY    FLUTICASONE  (FLONASE ) 50 MCG/ACT NASAL SPRAY    2 sprays by Each Nostril route daily    LOSARTAN  (COZAAR ) 100 MG TABLET    TAKE 1 TABLET BY  MOUTH EVERY DAY    METFORMIN  (GLUCOPHAGE -XR) 500 MG EXTENDED RELEASE TABLET    Take 1 tablet by mouth daily (with breakfast)     ALLERGIES     Shellfish allergy  FAMILY HISTORY       Family History   Problem Relation Age of Onset    Hypertension Mother     Diabetes Father     Hypertension Father     Hypertension Sister     Diabetes Sister         SOCIAL HISTORY       Social History     Socioeconomic History    Marital status: Single   Tobacco Use    Smoking status: Former     Current packs/day: 0.00     Average packs/day: 0.5 packs/day for 16.0 years (8.0 ttl pk-yrs)     Types: Cigarettes     Start date: 4     Quit date: 2011     Years since quitting: 14.5  Smokeless tobacco: Never   Vaping Use    Vaping status: Never Used   Substance and Sexual Activity    Alcohol use: Yes    Drug use: Never     SCREENINGS       Glasgow Coma Scale  Eye Opening: Spontaneous  Best Verbal Response: Oriented  Best Motor Response: Obeys commands  Glasgow Coma Scale Score: 15             CIWA Assessment  BP: 127/61  Pulse: 85           PHYSICAL EXAM         ED Triage Vitals [01/05/24 0142]   BP Systolic BP Percentile Diastolic BP Percentile Temp Temp Source Pulse Respirations SpO2   (!) 150/91 -- -- 98.5 F (36.9 C) Oral 85 16 98 %      Height Weight - Scale         1.651 m (5' 5) 117 kg (258 lb)             Physical Exam  Constitutional:       General: He is not in acute distress.     Appearance: Normal appearance.   HENT:      Head: Normocephalic and atraumatic.      Mouth/Throat:      Mouth: Mucous membranes are moist.      Comments: Airway patent  Eyes:      Extraocular Movements: Extraocular movements intact.      Pupils: Pupils are equal, round, and reactive to light.   Cardiovascular:      Rate and Rhythm: Normal rate.      Comments: Peripheral circulation/perfusion normal  Pulmonary:      Effort: Pulmonary effort is normal. No respiratory distress.   Abdominal:      General: There is distension.      Tenderness: There is  generalized abdominal tenderness. There is no guarding.      Comments: Diffuse tenderness and distention.   Musculoskeletal:         General: No deformity. Normal range of motion.      Cervical back: Normal range of motion and neck supple.   Skin:     General: Skin is warm and dry.   Neurological:      General: No focal deficit present.      Mental Status: He is alert and oriented to person, place, and time.   Psychiatric:         Mood and Affect: Mood normal.         Behavior: Behavior normal.         DIAGNOSTIC RESULTS   EKG:     Not indicated    RADIOLOGY:       Diverticulitis with perforation, per my interpretation    Interpretation per the Radiologist below, if available at the time of this note:    CT ABDOMEN PELVIS W IV CONTRAST Additional Contrast? None   Preliminary Result      1.  Perforated sigmoid diverticulitis, with surrounding free air and stranding.   2.  No bowel obstruction.  Normal gallbladder and appendix      Incidentals:   - Mild distal esophageal wall thickening.  Small hiatal hernia   - No obstructive uropathy.    - No hepatic or pancreatic mass.     - No abdominal aortic aneurysm.    - No acute osseous abnormality.    - No acute abnormality within the visualized lungs.    -  No acute abnormality within the visualized soft tissues.       Case discussed with Dr. Pecola of the ED at [address]       Morene Coaster, M.D.   This report has been electronically signed and verified by the Radiologist whose    name is printed above.          This report contains privileged and confidential information and is intended    solely for the use of the individual or entity to which it is addressed. If you    are not the intended recipient of this report, you are hereby notified that any    copying, distribution, dissemination or action taken in relation to the contents    of this report is strictly prohibited and may be unlawful. If you have received    this report in error, please notify the sender  immediately at [phone] and    permanently delete the original report and destroy any copies or printouts.                   LABS:  Labs Reviewed   CBC WITH AUTO DIFFERENTIAL - Abnormal; Notable for the following components:       Result Value    RBC 6.05 (*)     MCV 73.1 (*)     MCH 23.1 (*)     Neutrophils % 85.1 (*)     Lymphocytes 8.2 (*)     Neutrophils Absolute 8.5 (*)     Lymphocytes Absolute 0.8 (*)     Immature Granulocytes % 0.8 (*)     Immature Grans (Abs) 0.08 (*)     All other components within normal limits   COMPREHENSIVE METABOLIC PANEL - Abnormal; Notable for the following components:    Glucose 129 (*)     All other components within normal limits   URINALYSIS W/ RFLX MICROSCOPIC - Abnormal; Notable for the following components:    Protein, UA 30 (*)     All other components within normal limits   MICROSCOPIC URINALYSIS - Abnormal; Notable for the following components:    MUCUS, URINE Moderate (*)     BACTERIA, URINE Few (*)     Amorphous, UA Few (*)     All other components within normal limits    Narrative:     Added on by Discern Rule   CULTURE, BLOOD 1   CULTURE, BLOOD 2   LIPASE   MAGNESIUM    LACTATE, SEPSIS   LACTATE, SEPSIS       All other labs were within normal range or not returned as of this dictation.  EMERGENCY DEPARTMENT COURSE and DIFFERENTIAL DIAGNOSIS/MDM:   Vitals:    Vitals:    01/05/24 0142 01/05/24 0315 01/05/24 0316   BP: (!) 150/91 127/61    Pulse: 85     Resp: 16 16    Temp: 98.5 F (36.9 C)  (!) 101.7 F (38.7 C)   TempSrc: Oral  Oral   SpO2: 98%  96%   Weight: 117 kg (258 lb)     Height: 1.651 m (5' 5)           Medical Decision Making  43 year old presents due to abdominal pain.  Differential includes SBO, urinary tension, gastritis, enteritis, colitis, diverticulitis, metabolic, anemia, infectious, other.  See orders for plan.    Results and imaging reviewed and interpreted.  Patient has a perforated diverticulitis.  Patient was started on antibiotics.  Last ate  at  4 PM yesterday.  Will remain n.p.o. and discuss with general surgery.    Discussed with general surgery who recommends continued IV antibiotics and admission to the hospitalist.  General surgery will follow.  No definitive plans for OR at this time.    On reevaluation, the patient is resting comfortably.  He does have a fever so Tylenol  is ordered.  He was placed on 2 L of oxygen by nasal cannula due to low oxygen saturations after receiving IV Dilaudid , although the patient also has a history of sleep apnea.    Discussed with hospitalist at [facility] for transfer/admission.  Patient and family updated on plan and in agreement.    Amount and/or Complexity of Data Reviewed  Labs: ordered.  Radiology: ordered.    Risk  OTC drugs.  Prescription drug management.          REASSESSMENT          CONSULTS:  None    PROCEDURES:  Unless otherwise noted below, none     Procedures    FINAL IMPRESSION      1. Perforation of sigmoid colon due to diverticulitis    2. Generalized abdominal pain          DISPOSITION/PLAN   DISPOSITION Decision To Transfer 01/05/2024 04:06:22 AM      PATIENT REFERRED TO:  No follow-up provider specified.    DISCHARGE MEDICATIONS:  New Prescriptions    No medications on file     (Please note that portions of this note were completed with a voice recognition program.  Efforts were made to edit the dictations but occasionally words are mis-transcribed.)    Prentice GORMAN Helper, MD (electronically signed)  Attending Emergency Physician            Helper Prentice GORMAN, MD  01/05/24 [phone]

## 2024-01-05 NOTE — Progress Notes (Signed)
 [facility] Hospitalist Service     Hospitalist Progress Note     PCP: Patton Lauraine HERO, MD   Admission Date: 01/05/2024        Subjective   Overnight activity reviewed with nursing staff.  Patient arrived early this morning has not been able to get much sleep.  He is currently on a CPAP machine.  He admits to abdominal pain that is present only when his stomach is pressed.  He denies passing any stool or flatus.  He admits to fevers but no more nausea or vomiting.       Objective   Blood pressure (!) 168/91, pulse 95, temperature (!) 100.9 F (38.3 C), temperature source Oral, resp. rate 18, height 1.651 m (5' 5), weight 117 kg (258 lb), SpO2 92%.   Temp (24hrs), Avg:100.8 F (38.2 C), Min:98.5 F (36.9 C), Max:102.6 F (39.2 C)    Body mass index is 42.93 kg/m.    No intake or output data in the 24 hours ending 01/05/24 1119      Physical Exam:  General: Alert, cooperative, non-toxic appearing, appears somewhat comfortable  Lungs: Clear to auscultation without wheeze rales or ronchi. Breathing is non-labored  Heart: Normal rate, regular rhythm, no murmur, gallop or edema  Musculoskeletal: Warm, well perfused, no joint swelling or tenderness, no pitting edema  Abdomen: Firm, slightly tender to palpation in lower quadrant non-distended, no bowel sounds  Neuro: No tremor  Mental Status: Alert & oriented x 3     Labs and Imaging     Data: I have personally reviewed all lab results and independently reviewed imaging studies performed in the past 24 hours.    Hematologic/Coags Chemistries   Recent Labs     01/05/24  0202 01/05/24  0624   WBC 10.0 8.6   HGB 14.0 13.0   HCT 44.2 40.7   PLT 224 226     Lab Results   Component Value Date/Time    ALBUMIN 4.0 01/05/2024 06:24 AM     No components found for: HGBA1C  Lab Results   Component Value Date/Time    INR 1.1 01/05/2024 06:24 AM    PROTIME 14.5 01/05/2024 06:24 AM     No results found for: APTT  No results found for: DDIMER   Recent Labs     01/05/24  0202  01/05/24  0624   NA 136 138   K 4.2 4.2   CL 99 101   CO2 24 25   BUN 16 17   CREATININE 1.2 1.2   PROTIME  --  14.5   ALBUMIN 4.2 4.0   BILITOT 0.82 1.07   ALKPHOS 69 68   AST 28 22   ALT 39 33     No results for input(s): GLU in the last 72 hours.  No results found for: CPK, CKMB, TROPONINI  Lab Results   Component Value Date/Time    IRON 91 01/25/2019 09:48 AM    FERRITIN 215.8 01/25/2019 09:48 AM        Inflammatory/Respiratory Diabetes   No results found for: CRP  No results found for: ESR  ABGs:  No results found for: PHART, PO2ART, HCO3, PCO2ART   Lab Results   Component Value Date/Time    LDL 156.2 06/17/2022 12:30 PM    CREATININE 1.2 01/05/2024 06:24 AM              Studies:  No orders to display       No results found for  this or any previous visit.      Medications      piperacillin -tazobactam  4,500 mg IntraVENous Q8H    sodium chloride  flush  5-40 mL IntraVENous 2 times per day    insulin  lispro  0-8 Units SubCUTAneous 4 times per day    sodium chloride  flush  5-40 mL IntraVENous 2 times per day       sodium chloride       dextrose       sodium chloride  100 mL/hr at 01/05/24 9366    sodium chloride         (Note: the above list excludes PRNs)    Assessment & Plan     Hospital Problems           Last Modified POA    * (Principal) Diverticulitis of colon with perforation 01/05/2024 Yes        #Acute diverticulitis with bowel perforation:  #Sepsis:  Patient meets sepsis criteria with heart rate greater than 90 and fever.  WBC 10 with neutrophil predominance.  Lactate within normal limits x 2.  Blood pressure slightly elevated.  CT abdomen pelvis was concerning for perforated sigmoid diverticulitis with surrounding free air and stranding.   Lactate, white blood cell count still remains within normal limits.  - General surgery following, appreciate assistance   -Plan for OR this afternoon for diagnostic laparoscopy, possible expiratory laparotomy possible bowel resection and ostomy  creation  - Continue IV Zosyn  4.5 g every 8 hours  - Continuous normal saline 100 cc/hour  - As needed IV Dilaudid  for pain, as needed IV Zofran  for nausea    #Hypertension:  - As patient is strict n.p.o. and has sepsis with risk of decompensation, will hold home losartan  for now     #T2DM:  Only on metformin  at home  - Hold metformin  and place on sliding scale insulin  with every 6 hour glucose checks while n.p.o.     #Morbid obesity with BMI 40-45:  - Complicates all aspects of care and increases surgical risk     #OSA:  - Continue nightly BiPAP     #Daily alcohol use:  Patient drinks about 3 alcoholic beverages daily.  Patient denies any history of alcohol withdrawal.  Does not appear to be withdrawing at present.  -Will watch on CIWA      Diet NPO   Full Code  DVT ppx:  SCD's or Sequential Compression Device  Dispo: Continue inpatient management for perforated diverticulitis's, sepsis, plan for surgery this afternoon  Aliene ONEIDA Ada, MD  01/05/2024 11:19 AM  [facility] Hospitalist Service    ++++++++++++++++++++++++++++++++++++++++    This note was created using voice recognition software and may contain typographic errors missed during final review. The intent is to have a complete and accurate medical record.   As a valued partner in this safety effort, if you have noted factual errors, please contact the [facility] Hospitalist Service at [phone].

## 2024-01-05 NOTE — H&P (Signed)
 Hospitalist History and Physical      CHIEF COMPLAINT:    Abdominal Pain    Reason for Admission:  Acute Diverticulitis with Perforation    History Obtained From:  Patient    HISTORY OF PRESENT ILLNESS:      Frank Harris is a 43 year old male with past medical history of T2DM (not on long-term insulin ), HTN, OSA on nightly BiPAP, and morbid obesity with BMI of 43 who presented to the [facility] ED overnight with abdominal pain.    Patient endorsed lower abdominal pain for 3 days which is worsened and has become diffuse throughout the entire abdomen over the past 24 hours with nausea but no vomiting.    On the ED he was febrile at 101.7, heart rate 80s-90s, BP 150/90, RR 16, and SpO2 98% on room air.  Labs are notable for WBC of 10.0k with 85% neutrophils, lactate within normal limits x 2, and noninfectious appearing urinalysis.  CT abdomen pelvis was concerning for perforated sigmoid diverticulitis with surrounding free air and stranding.  The ED physician discussed case with general surgery, Dr. Sheffield.  She wanted to try conservative management first with IV antibiotics given that patient was hemodynamically stable and has elevated surgical risk due to his comorbidities. She requested that patient be placed on the hospitalist service and general surgery will follow in consultation.  Patient was given multiple rounds of IV narcotic pain medications, 1 L of normal saline, and 4.5 g of IV Zosyn  in the ED.  Due to lack of available beds at [facility] he was transferred to [facility] for further care.    On arrival to [facility] this morning he is febrile with temperature of 102.54F, mildly tachycardic with heart rate of 99, BP 161/94 and arrived on 3 L nasal cannula as he generally wears BiPAP at night.  Patient reports that around 3 days ago he started having crampy lower abdominal pain.  Yesterday while at work his abdominal pain increased significantly and became more diffuse.  The pain  worsens with any kind of movement.  He also started having fevers yesterday morning.  He denies any recent diarrhea, melena, or hematochezia.  He actually states that he was constipated in recent days and last night he took some magnesium  citrate which caused him to have a large loose bowel movement.  He endorses increased fatigue and generalized weakness.  He denies any prior abdominal surgeries.  He denies any shortness of breath at rest, chest discomfort, lower extremity edema, or urinary symptoms.  He does drink alcohol daily, about 3 beverages daily.  He denies any history of alcohol withdrawal.    Past Medical History:     has a past medical history of Microcytic anemia and Sarcoidosis.     Past Surgical History:    Past Surgical History:   Procedure Laterality Date    FRACTURE SURGERY Right     leg        Medications Prior to Admission:    Current Outpatient Medications   Medication Instructions    acetaminophen  (TYLENOL ) 650 MG extended release tablet 1 tablet, Oral, EVERY 8 HOURS PRN    albuterol  sulfate HFA (PROVENTIL ;VENTOLIN ;PROAIR ) 108 (90 Base) MCG/ACT inhaler 2-4 puff as needed Inhalation every 4 hrs for 30 days    amLODIPine  (NORVASC ) 10 MG tablet TAKE 1 TABLET BY MOUTH EVERY DAY    fluticasone  (FLONASE ) 50 MCG/ACT nasal spray 2 sprays, Each Nostril, DAILY    losartan  (COZAAR ) 100 MG tablet TAKE  1 TABLET BY MOUTH EVERY DAY    metFORMIN  (GLUCOPHAGE -XR) 500 mg, Oral, DAILY WITH BREAKFAST       Allergies:    Shellfish allergy    Social History:   Social History       Tobacco History       Smoking Status  Former Smoking Start Date  1995 Quit Date  2011 Average Packs/Day  0.5 packs/day for 16.0 years (8.0 ttl pk-yrs) Smoking Tobacco Type  Cigarettes from 1995 to 2011   Pack Year History     Packs/Day From To Years    0 2011  14.6    0.5 1995 2011 16.0      Smokeless Tobacco Use  Never              Alcohol History       Alcohol Use Status  Yes              Drug Use       Drug Use Status  Never               Sexual Activity       Sexually Active  Not Asked                     Family History:   Family History   Problem Relation Age of Onset    Hypertension Mother     Diabetes Father     Hypertension Father     Hypertension Sister     Diabetes Sister         REVIEW OF SYSTEMS:  I have performed a 12 point review of systems on this patient and is negative except for positive portions mentioned in HPI above.     PHYSICAL EXAM:  Vitals:  BP (!) 161/94   Pulse 99   Temp (!) 102.6 F (39.2 C) (Oral)   Resp 18   SpO2 95%   There is no height or weight on file to calculate BMI.   General: Pleasant obese middle-aged African-American male, mildly uncomfortable appearing, but in no acute distress  HEENT: NCAT. Oropharynx clear  Cardiac: Mildly tachycardic rate, regular rhythm, no murmur  Extremities: No LE edema  Pulmonary: Unlabored respirations on 3 L nasal cannula (placed on this due to OSA, not hypoxia) lungs clear to auscultation bilaterally  GI: Mildly distended.  Diffuse abdominal tenderness most significant in lower quadrants without guarding present although did just receive IV antibiotics.  MSK: no joint tenderness or swelling  Skin: dry and warm   Neuro: No confusio  Psych: appropriate mood and affect    LABS:  Lab Results   Component Value Date    NA 136 01/05/2024    K 4.2 01/05/2024    CL 99 01/05/2024    CO2 24 01/05/2024    BUN 16 01/05/2024    CREATININE 1.2 01/05/2024    GLUCOSE 129 (H) 01/05/2024    CALCIUM 9.1 01/05/2024    BILITOT 0.82 01/05/2024    ALKPHOS 69 01/05/2024    AST 28 01/05/2024    ALT 39 01/05/2024    LABGLOM 76 01/05/2024    GFRAA 122 08/17/2020    GLOB 3.3 01/05/2024       Lab Results   Component Value Date    WBC 10.0 01/05/2024    HGB 14.0 01/05/2024    HCT 44.2 01/05/2024    MCV 73.1 (L) 01/05/2024    PLT 224 01/05/2024       No  results found for: CKTOTAL, CKMB, CKMBINDEX, TROPONINI    Lab Results   Component Value Date/Time    APPEARANCE Clear 01/05/2024 03:12 AM    COLORU  Yellow 01/05/2024 03:12 AM    NITRU Negative 01/05/2024 03:12 AM    GLUCOSEU Negative 01/05/2024 03:12 AM    KETUA Negative 01/05/2024 03:12 AM    UROBILINOGEN 0.2 01/05/2024 03:12 AM    BILIRUBINUR Negative 01/05/2024 03:12 AM       IMAGING:  CT ABDOMEN PELVIS W IV CONTRAST Additional Contrast? None  Result Date: 01/05/2024  Reason For Exam (entered by Technologist):  gen ab pain Other Notes (entered by Technologist): lower abd pain for 2 days Additional Information (per [facility] Radiologist): CT ABDOMEN/PELVIS WITH CONTRAST     1.  Perforated sigmoid diverticulitis, with surrounding free air and stranding. 2.  No bowel obstruction.  Normal gallbladder and appendix Incidentals: - Mild distal esophageal wall thickening.  Small hiatal hernia - No obstructive uropathy. - No hepatic or pancreatic mass.  - No abdominal aortic aneurysm. - No acute osseous abnormality. - No acute abnormality within the visualized lungs. - No acute abnormality within the visualized soft tissues. Case discussed with Dr. Pecola of the ED at [address] Coaster, M.D. This report has been electronically signed and verified by the Radiologist whose  name is printed above.  This report contains privileged and confidential information and is intended solely for the use of the individual or entity to which it is addressed. If you are not the intended recipient of this report, you are hereby notified that any copying, distribution, dissemination or action taken in relation to the contents  of this report is strictly prohibited and may be unlawful. If you have received  this report in error, please notify the sender immediately at [phone] and permanently delete the original report and destroy any copies or printouts.       ASSESSMENT AND PLAN:    Frank Harris is a 43 year old male with past medical history of T2DM (not on long-term insulin ), HTN, OSA on nightly BiPAP, and morbid obesity with BMI of 43 who presented to the [facility]  ED overnight 7/24-7/25 with abdominal pain.  He was found to have acute diverticulitis with perforation.  General surgery is watching patient closely on conservative management with IV antibiotics, hoping to avoid surgery. He was transferred to [facility] under the hospitalist service due to lack of available beds at [facility].    Acute diverticulitis with bowel perforation  Sepsis  Patient meets sepsis criteria with heart rate greater than 90 and fever.  WBC 10 with neutrophil predominance.  Lactate within normal limits x 2.  Blood pressure slightly elevated.  CT abdomen pelvis was concerning for perforated sigmoid diverticulitis with surrounding free air and stranding.  The ED physician discussed case with general surgery, Dr. Sheffield.  She elects to try conservative management first with IV antibiotics given that patient was hemodynamically stable and elevated surgical risk due to his comorbidities.  Patient was given multiple rounds of IV narcotic pain medications, 1 L of normal saline, and 4.5 g of IV Zosyn  in the ED.   - Continue IV Zosyn  4.5 g every 8 hours  - Continuous normal saline 100 cc/hour  - Keep n.p.o., high likelihood that he will need surgery  - General Surgery following, appreciate continued recommendations  - As needed IV Dilaudid  for pain, as needed IV Zofran  for nausea  - Will repeat lactic acid with morning labs  Hypertension  - As patient is strict n.p.o. and has sepsis with risk of decompensation, will hold home losartan  for now    T2DM  Only on metformin  at home  - Hold metformin  and place on sliding scale insulin  with every 6 hour glucose checks while n.p.o.    Morbid obesity with BMI 40-45  - Complicates all aspects of care and increases surgical risk    OSA  - Continue nightly BiPAP    Daily alcohol use  Patient drinks about 3 alcoholic beverages daily.  Patient denies any history of alcohol withdrawal.  Does not appear to be withdrawing at present.  -Will watch on  CIWA    DVT prophylaxis: SCDs only given high likelihood patient will need surgical procedure  Dispo: Inpatient admission  Code status: Full Code   Diet: N.p.o.  PCP: Patton Lauraine HERO, MD     This note was created using voice recognition software and may contain typographic errors missed during final review. The intent is to have a complete and accurate medical record.  As a valued partner in this safety effort, if you have noted factual errors, please complete the Health Information Amendment/Correct Form or call the [facility] Health Information Management Office at [phone].    Glendia Kalata MD  Hospitalist

## 2024-01-05 NOTE — Anesthesia Post-Procedure Evaluation (Signed)
 Department of Anesthesiology  Postprocedure Note    Patient: Frank Harris  MRN: [id number]  Birthdate: [birth year]  Date of evaluation: 01/05/2024    Procedure Summary       Date: 01/05/24 Room / Location: [facility] OR 03 / [facility] MAIN OR    Anesthesia Start: 1442 Anesthesia Stop: 1823    Procedures:       LAPAROTOMY EXPLORATORY, SIGMOIDECTOMY WITH PRIMARY ANASTAMOSIS, CREATION OF DIVERTING LOOP ILEOSTOMY      LAPAROSCOPY DIAGNOSTIC Diagnosis:       Intestinal obstruction, unspecified cause, unspecified whether partial or complete (HCC)      (Intestinal obstruction, unspecified cause, unspecified whether partial or complete (HCC) [K56.609])    Surgeons: Sheffield Donnajean HERO, MD Responsible Provider: Meade Aleda Girt, MD    Anesthesia Type: general ASA Status: 3            Anesthesia Type: No value filed.    Aldrete Phase I: Aldrete Score: 7    Aldrete Phase II:      Anesthesia Post Evaluation    Patient location during evaluation: PACU  Patient participation: complete - patient participated  Level of consciousness: sleepy but conscious  Airway patency: patent  Nausea & Vomiting: no nausea and no vomiting  Cardiovascular status: hemodynamically stable  Respiratory status: acceptable, nonlabored ventilation, spontaneous ventilation and nasal airway  Hydration status: euvolemic  Comments: Vital signs in PACU reviewed by anesthesia provider and/or documented in Handoff.  Multimodal analgesia pain management approach  Pain management: adequate    No notable events documented.

## 2024-01-05 NOTE — Care Coordination-Inpatient (Signed)
 Date: 01/05/24 Time 1030    Initial assessment completed on  Frank Harris via CHART SCREEN  to determine transition of care needs. Pt currently hospitalized for Diverticulitis of colon with perforation [K57.20].  Patient lives with: (P) Spouse/Significant Other Type of Home: House   Current services prior to admission: (P) None  Current DME:  None. Type of Home Care services:  (P) None     Pt fills medications at   [id number] - [place], [place] - [address] - P [phone] - F [phone]  [address]  [place] [address]  Phone: [phone] Fax: [phone]      Likely discharge plan: Home and No CM Needs  Vs Home w/HH  Case Manager will follow to finalize plans and work to transition patient to the next level of care.       Completed by: JAYSON Persons MSN RN  Case Management Department       01/05/24 1022   Service Assessment   History Provided By Medical Record   Support Systems Spouse/Significant Other   Discharge Planning   Living Arrangements Spouse/Significant Other   Current Services Prior To Admission None   Potential Assistance Needed N/A   Type of Home Care Services None   Condition of Participation: Discharge Planning   The Plan for Transition of Care is related to the following treatment goals: Home vs Home w/HH

## 2024-01-05 NOTE — Anesthesia Pre-Procedure Evaluation (Signed)
 Department of Anesthesiology  Preprocedure Note       Name:  Frank Harris   Age:  43 y.o.  DOB:  [birth year]                                          MRN:  [id number]         Date:  01/05/2024      Surgeon: Clotilde):  Clevenger, Donnajean HERO, MD    Procedure: Procedure(s):  LAPAROTOMY EXPLORATORY, COLON RESECTION, POSS OSTOMY CREATION  LAPAROSCOPY DIAGNOSTIC    Medications prior to admission:   Prior to Admission medications    Medication Sig Start Date End Date Taking? Authorizing Provider   amLODIPine  (NORVASC ) 10 MG tablet TAKE 1 TABLET BY MOUTH EVERY DAY  Patient taking differently: Take 1 tablet by mouth daily 12/07/23  Yes Crickman, Lauraine HERO, MD   losartan  (COZAAR ) 100 MG tablet TAKE 1 TABLET BY MOUTH EVERY DAY  Patient taking differently: Take 1 tablet by mouth daily 12/07/23  Yes Crickman, Lauraine HERO, MD   albuterol  sulfate HFA (PROVENTIL ;VENTOLIN ;PROAIR ) 108 (90 Base) MCG/ACT inhaler 2-4 puff as needed Inhalation every 4 hrs for 30 days  Patient taking differently: Inhale 2-4 puffs into the lungs every 4 hours as needed for Shortness of Breath or Wheezing 12/07/23  Yes Crickman, Lauraine HERO, MD   metFORMIN  (GLUCOPHAGE -XR) 500 MG extended release tablet Take 1 tablet by mouth daily (with breakfast) 12/07/23  Yes Patton Lauraine HERO, MD   fluticasone  (FLONASE ) 50 MCG/ACT nasal spray 2 sprays by Each Nostril route daily 12/07/23  Yes Crickman, Lauraine HERO, MD   acetaminophen  (TYLENOL ) 650 MG extended release tablet Take 1 tablet by mouth every 8 hours as needed for Pain   Yes [provider]   ibuprofen (ADVIL;MOTRIN) 800 MG tablet Take 1 tablet by mouth every 8 hours as needed for Pain  Patient not taking: Reported on 01/05/2024    [provider]   lidocaine  (LIDODERM ) 5 % Place 1 patch onto the skin daily 12 hours on, 12 hours off.  Patient not taking: Reported on 01/05/2024    [provider]   linaclotide LARUE) 290 MCG CAPS capsule Take 1 capsule by mouth every morning (before  breakfast)  Patient not taking: Reported on 01/05/2024    [provider]   methocarbamol  (ROBAXIN ) 500 MG tablet Take 2 tablets by mouth every 6 hours as needed (Muscle Spasms)  Patient not taking: Reported on 01/05/2024    [provider]   Fexofenadine-Pseudoephedrine (ALLEGRA-D 12 HOUR PO) Take 1 tablet by mouth 2 times daily as needed (Allergies)    [provider]       Current medications:    Current Facility-Administered Medications   Medication Dose Route Frequency Provider Last Rate Last Admin    piperacillin -tazobactam (ZOSYN ) 4,500 mg in sodium chloride  0.9 % 100 mL IVPB (mini-bag)  4,500 mg IntraVENous Q8H Glinda Hamilton T, MD 25 mL/hr at 01/05/24 0923 4,500 mg at 01/05/24 9076    HYDROmorphone  (DILAUDID ) injection 0.5 mg  0.5 mg IntraVENous Q3H PRN Glinda Hamilton T, MD        Or    HYDROmorphone  (DILAUDID ) injection 1 mg  1 mg IntraVENous Q3H PRN Glinda Hamilton T, MD   1 mg at 01/05/24 9388    sodium chloride  flush 0.9 % injection 5-40 mL  5-40 mL IntraVENous 2 times per  day Glinda Hamilton T, MD   10 mL at 01/05/24 9076    sodium chloride  flush 0.9 % injection 5-40 mL  5-40 mL IntraVENous PRN Glinda Hamilton T, MD        0.9 % sodium chloride  infusion   IntraVENous PRN Glinda Hamilton T, MD        magnesium  sulfate 2000 mg in water 50 mL IVPB  2,000 mg IntraVENous PRN Glinda Hamilton T, MD        ondansetron  (ZOFRAN -ODT) disintegrating tablet 4 mg  4 mg Oral Q6H PRN Glinda Hamilton T, MD        Or    ondansetron  (ZOFRAN ) injection 4 mg  4 mg IntraVENous Q4H PRN Glinda Hamilton T, MD        bisacodyl  (DULCOLAX) suppository 10 mg  10 mg Rectal Daily PRN Glinda Hamilton T, MD        aluminum & magnesium  hydroxide-simethicone (MAALOX PLUS) 200-200-20 MG/5ML suspension 30 mL  30 mL Oral Q6H PRN Glinda Hamilton T, MD        glucose chewable tablet 16 g  4 tablet Oral PRN Glinda Hamilton T, MD        dextrose  bolus 10% 125 mL  125 mL IntraVENous PRN Glinda Hamilton T, MD        Or    dextrose  bolus 10% 250 mL  250 mL  IntraVENous PRN Glinda Hamilton DASEN, MD        glucagon  emergency SOLR 1 mg  1 mg SubCUTAneous PRN Glinda Hamilton T, MD        dextrose  10 % infusion   IntraVENous Continuous PRN Glinda Hamilton T, MD        insulin  lispro (HUMALOG ,ADMELOG ) injection vial 0-8 Units  0-8 Units SubCUTAneous 4 times per day Glinda Hamilton T, MD        0.9 % sodium chloride  infusion   IntraVENous Continuous Glinda Hamilton T, MD 100 mL/hr at 01/05/24 9366 New Bag at 01/05/24 9366    sodium chloride  flush 0.9 % injection 5-40 mL  5-40 mL IntraVENous 2 times per day Glinda Hamilton T, MD   10 mL at 01/05/24 9076    sodium chloride  flush 0.9 % injection 5-40 mL  5-40 mL IntraVENous PRN Glinda Hamilton T, MD        0.9 % sodium chloride  infusion   IntraVENous PRN Glinda Hamilton T, MD        LORazepam  (ATIVAN ) tablet 1 mg  1 mg Oral Q1H PRN Glinda Hamilton T, MD        Or    LORazepam  (ATIVAN ) tablet 0.5 mg  0.5 mg Oral Q4H PRN Glinda Hamilton T, MD        Or    LORazepam  (ATIVAN ) tablet 2 mg  2 mg Oral Q1H PRN Glinda Hamilton T, MD        Or    LORazepam  (ATIVAN ) tablet 0.5 mg  0.5 mg Oral Q4H PRN Glinda Hamilton T, MD        Or    LORazepam  (ATIVAN ) tablet 3 mg  3 mg Oral Q1H PRN Glinda Hamilton T, MD        Or    LORazepam  (ATIVAN ) tablet 0.5 mg  0.5 mg Oral Q4H PRN Glinda Hamilton T, MD        Or    LORazepam  (ATIVAN ) tablet 4 mg  4 mg Oral Q1H PRN Glinda Hamilton DASEN, MD        Or  LORazepam  (ATIVAN ) tablet 0.5 mg  0.5 mg Oral Q4H PRN Glinda Hamilton T, MD        acetaminophen  (TYLENOL ) tablet 650 mg  650 mg Oral Q4H PRN Georgina Aliene DASEN, MD           Allergies:    Allergies   Allergen Reactions    Shellfish Allergy Hives, Itching, Rash, Shortness Of Breath and Swelling       Problem List:    Patient Active Problem List   Diagnosis Code    Alpha thalassemia D56.0    Thalassemia D56.9    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    Daytime sleepiness R40.0    Essential (primary) hypertension I10    Family history of sarcoidosis Z83.2    Gastroesophageal reflux disease K21.9    GERD without  esophagitis K21.9    Hypertriglyceridemia E78.1    Lymphocytosis D72.820    Microcytic anemia D50.9    Microcytic red blood cells R71.8    Obesity with body mass index 30 or greater E66.9    Seasonal allergies J30.2    Type 2 diabetes mellitus without complication (HCC) E11.9    Other obesity due to excess calories E66.09    Vitamin D deficiency E55.9    Acute conjunctivitis of both eyes H10.33    COVID-19 U07.1    Low back pain M54.50    Myalgia, unspecified site M79.10    Neck pain M54.2    Diverticulitis of colon with perforation K57.20       Past Medical History:        Diagnosis Date    Diabetes mellitus, type 2 (HCC)     Hypertension, essential     Microcytic anemia     w/ elevated red blood cell mass    OSA treated with BiPAP     Sarcoidosis        Past Surgical History:        Procedure Laterality Date    FRACTURE SURGERY Right     leg       Social History:    Social History     Tobacco Use    Smoking status: Former     Current packs/day: 0.00     Average packs/day: 0.5 packs/day for 16.0 years (8.0 ttl pk-yrs)     Types: Cigarettes     Start date: 58     Quit date: 2011     Years since quitting: 14.5    Smokeless tobacco: Never   Substance Use Topics    Alcohol use: Yes                                Counseling given: Not Answered      Vital Signs (Current):   Vitals:    01/05/24 0700 01/05/24 0801 01/05/24 1134 01/05/24 1333   BP:  (!) 168/91 119/69 134/66   Pulse:  95 67 91   Resp:  18 18 18    Temp:  (!) 100.9 F (38.3 C) 100.3 F (37.9 C) 99.4 F (37.4 C)   TempSrc:  Oral Axillary Oral   SpO2:  92% 95% 100%   Weight: 117 kg (258 lb)      Height: 1.651 m (5' 5)  BP Readings from Last 3 Encounters:   01/05/24 134/66   01/05/24 (!) 157/87   12/07/23 (!) 150/92       NPO Status:                                                                                 BMI:   Wt Readings from Last 3 Encounters:   01/05/24 117 kg (258 lb)   01/05/24 117 kg (258 lb)    12/07/23 108 kg (238 lb)     Body mass index is 42.93 kg/m.    CBC:   Lab Results   Component Value Date/Time    WBC 8.6 01/05/2024 06:24 AM    RBC 5.54 01/05/2024 06:24 AM    HGB 13.0 01/05/2024 06:24 AM    HGB Negative 01/25/2019 09:48 AM    HCT 40.7 01/05/2024 06:24 AM    MCV 73.5 01/05/2024 06:24 AM    RDW 15.9 01/05/2024 06:24 AM    PLT 226 01/05/2024 06:24 AM       CMP:   Lab Results   Component Value Date/Time    NA 138 01/05/2024 06:24 AM    K 4.2 01/05/2024 06:24 AM    CL 101 01/05/2024 06:24 AM    CO2 25 01/05/2024 06:24 AM    BUN 17 01/05/2024 06:24 AM    CREATININE 1.2 01/05/2024 06:24 AM    GFRAA 122 08/17/2020 01:10 PM    LABGLOM 76 01/05/2024 06:24 AM    LABGLOM 95 09/23/2022 09:45 AM    GLUCOSE 114 01/05/2024 06:24 AM    CALCIUM 8.4 01/05/2024 06:24 AM    BILITOT 1.07 01/05/2024 06:24 AM    ALKPHOS 68 01/05/2024 06:24 AM    AST 22 01/05/2024 06:24 AM    ALT 33 01/05/2024 06:24 AM       POC Tests:   Recent Labs     01/05/24  1140   POCGLU 126.0*       Coags:   Lab Results   Component Value Date/Time    PROTIME 14.5 01/05/2024 06:24 AM    INR 1.1 01/05/2024 06:24 AM       HCG (If Applicable): No results found for: PREGTESTUR, PREGSERUM, HCG, HCGQUANT     ABGs: No results found for: PHART, PO2ART, PCO2ART, HCO3ART, BEART, O2SATART     Type & Screen (If Applicable):  Lab Results   Component Value Date    ABORH B POS 01/05/2024    LABANTI Negative 01/05/2024       Drug/Infectious Status (If Applicable):  No results found for: HIV, HEPCAB    COVID-19 Screening (If Applicable): No results found for: COVID19        Anesthesia Evaluation  Patient summary reviewed   no history of anesthetic complications:   Airway: Mallampati: II  TM distance: <3 FB   Neck ROM: full  Mouth opening: > = 3 FB   Dental:          Pulmonary:   (+)     sleep apnea:  PE comment: Non-labored Cardiovascular:  Exercise tolerance: good (>4 METS)  (+) hypertension:        Rhythm:  regular  Rate: normal                    Neuro/Psych:   Negative Neuro/Psych ROS              GI/Hepatic/Renal:   (+) GERD:, morbid obesity         ROS comment: Perf diverticulum.   Endo/Other:    (+) DiabetesType II DM.                  ROS comment: Sepsis    Alpha thal Abdominal:             Vascular: negative vascular ROS.         Other Findings: Patient awake, alert, and oriented. No gross neurological deficits appreciated. Focused physical exam as above.             Anesthesia Plan      general     ASA 3       Induction: intravenous.    MIPS: Postoperative opioids intended and Prophylactic antiemetics administered.  Anesthetic plan and risks discussed with patient.        Attending anesthesiologist reviewed and agrees with Preprocedure content                Marsa VEAR Carls, MD   01/05/2024

## 2024-01-05 NOTE — Consults (Signed)
 General Surgery Consult Note              Reason for consult: Perforated diverticulitis    Consulted by: Dr. Georgina    HPI:  Frank Harris 43 y.o. male who carries a pmhx significant for T2DM on Metformin , HTN, OSA, sarcoidosis, and microcytic anemia who presented to the ED on 7/25 with complaints of abdominal pain and nausea. Per patient, he began with abdominal pain on Tuesday night. The pain was subtle and not severe at the time of onset. However, as time progressed throughout the day Wednesday and Thursday, the pain became severe. It is located in the suprapubic and b/l lower quadrants. The pain is accompanied with nausea, though he denies any episodes of emesis. He did take one mag citrate yesterday and had a loose BM afterwards that was non-bloody in nature. He presented to the ED overnight due to intense abdominal pain. He was currently in the process of meeting with a gastroenterologist to schedule a colonoscopy. He has not had one in the past. He denies any previous episodes of diverticulitis. Workup in the ED included a CTAP, which demonstrated no abscesses.  Labs on admission included a normal WBC at 10.0.  Hemoglobin of 14.  Normal lactate at 1.6.  Electrolytes within normal limits.  General surgery was consulted for further evaluation.    Patient seen and examined at bedside this morning.  He states he is comfortable when lying still, however has worsening pain with palpation and movement.  He has been febrile overnight with a Tmax of 102.6.  He states pain is a bit improved from time of presentation, though he has been getting analgesia.  No further nausea or vomiting.  Denies any chest pain, shortness of breath, headaches, or dizziness.     No past abdominal surgical history.  Has never had a colonoscopy.    Past Medical History:        Diagnosis Date    Diabetes mellitus, type 2 (HCC)     Hypertension, essential     Microcytic anemia     w/ elevated red blood cell mass    OSA treated with  BiPAP     Sarcoidosis        Past Surgical History:        Procedure Laterality Date    FRACTURE SURGERY Right     leg       Home Medications:   Medications Prior to Admission: amLODIPine  (NORVASC ) 10 MG tablet, TAKE 1 TABLET BY MOUTH EVERY DAY (Patient taking differently: Take 1 tablet by mouth daily)  losartan  (COZAAR ) 100 MG tablet, TAKE 1 TABLET BY MOUTH EVERY DAY (Patient taking differently: Take 1 tablet by mouth daily)  albuterol  sulfate HFA (PROVENTIL ;VENTOLIN ;PROAIR ) 108 (90 Base) MCG/ACT inhaler, 2-4 puff as needed Inhalation every 4 hrs for 30 days (Patient taking differently: Inhale 2-4 puffs into the lungs every 4 hours as needed for Shortness of Breath or Wheezing)  metFORMIN  (GLUCOPHAGE -XR) 500 MG extended release tablet, Take 1 tablet by mouth daily (with breakfast)  fluticasone  (FLONASE ) 50 MCG/ACT nasal spray, 2 sprays by Each Nostril route daily  acetaminophen  (TYLENOL ) 650 MG extended release tablet, Take 1 tablet by mouth every 8 hours as needed for Pain  ibuprofen (ADVIL;MOTRIN) 800 MG tablet, Take 1 tablet by mouth every 8 hours as needed for Pain (Patient not taking: Reported on 01/05/2024)  lidocaine  (LIDODERM ) 5 %, Place 1 patch onto the skin daily 12 hours on, 12 hours off. (Patient not  taking: Reported on 01/05/2024)  linaclotide (LINZESS) 290 MCG CAPS capsule, Take 1 capsule by mouth every morning (before breakfast) (Patient not taking: Reported on 01/05/2024)  methocarbamol  (ROBAXIN ) 500 MG tablet, Take 2 tablets by mouth every 6 hours as needed (Muscle Spasms) (Patient not taking: Reported on 01/05/2024)  Fexofenadine-Pseudoephedrine (ALLEGRA-D 12 HOUR PO), Take 1 tablet by mouth 2 times daily as needed (Allergies)     Allergies:    Shellfish allergy    Social History:   TOBACCO:   reports that he quit smoking about 14 years ago. His smoking use included cigarettes. He started smoking about 30 years ago. He has a 8 pack-year smoking history. He has never used smokeless tobacco.  ETOH:    reports current alcohol use.  DRUGS:   reports no history of drug use.    Family History:       Problem Relation Age of Onset    Hypertension Mother     Diabetes Father     Hypertension Father     Hypertension Sister     Diabetes Sister        REVIEW OF SYSTEMS:    Constitutional: +fevers/chills.  Respiratory: No shortness of breath, cough  Cardiovascular: No Chest pain, palpitations, syncope  Gastrointestinal: +abdominal pain, nausea. No vomiting, diarrhea  Musculoskeletal: No back pain, neck pain, joint pain, muscle pain, decreased range of motion  Integumentary: No rash, pruritus, abrasions  Neurologic: Alert & oriented X 4, No weakness, numbness, change in coordination, confusion  Psychiatric: No anxiety, depression, No change in mood, concentration, sleep patterns     Except as otherwise documented above, the patient's complete review of systems was unremarkable.    PHYSICAL EXAM:    VITALS:  BP (!) 168/91   Pulse 95   Temp (!) 100.9 F (38.3 C) (Oral)   Resp 18   Ht 1.651 m (5' 5)   Wt 117 kg (258 lb)   SpO2 92%   BMI 42.93 kg/m   General: Awake, Well-Nourished, Non-toxic.  Cardio: Regular, Normal Rate.  Resp: No audible wheezes, Non-labored, Even. On 4L NC.  GI: Softly distended. TTP in the suprapubic and b/l lower quadrants. Non-peritoneal.   Peripheral: Pulses palpable, No edema.  Skin: Warm, dry, no rashes.  Psych: Cooperative, Appropriate Mood and affect.    Labs:  CBC:  Recent Labs     01/05/24  0202 01/05/24  0624   WBC 10.0 8.6   RBC 6.05* 5.54   HGB 14.0 13.0   HCT 44.2 40.7   MCV 73.1* 73.5*   RDW 15.9 15.9   PLT 224 226     CHEMISTRIES:  Recent Labs     01/05/24  0202 01/05/24  0624   NA 136 138   K 4.2 4.2   CL 99 101   CO2 24 25   BUN 16 17   CREATININE 1.2 1.2   GLUCOSE 129* 114*   MG 2.2  --      LIVER PROFILE:  Recent Labs     01/05/24  0202 01/05/24  0624   AST 28 22   ALT 39 33   BILITOT 0.82 1.07   ALKPHOS 69 68     COAG:  PT/INR:  Recent Labs     01/05/24  0624   PROTIME 14.5    INR 1.1*     APTT:No results for input(s): APTT in the last 72 hours.    Diagnostic Results:  CT ABDOMEN PELVIS W IV CONTRAST Additional Contrast?  None  Result Date: 01/05/2024  1. Perforated sigmoid diverticulitis with focal contained free air adjacent sigmoid mesocolon. No drainable abscess currently. 2. Hepatic steatosis. Small hiatal hernia. Initial report by [facility].      ASSESSMENT AND PLAN:    43 year old male with a past medical history as listed above who presented to the emergency department overnight with complaints of abdominal pain and nausea.  Workup in the emergency department included a CTAP which demonstrated contained free air without abscess.  He was admitted to the medical service and general surgery was consulted for further evaluation.    Perforated diverticulitis  - Chart/labs reviewed. He is febrile, Tmax of 102.20F. Trending down to 100.9 this AM. WBC is normal at 8.6. Lactate is WNL at 1.0. Hgb is stable at 13.0.  - CTAP personally reviewed. There is an area of perforation with contained free air seen in the proximal sigmoid colon.   - Discussed with Dr. Sheffield, as well as with patient and wife. Given the pneumoperitoneum present in the abdomen, as well as tenderness on abdominal exam, we will proceed to the operating room this afternoon for a diagnostic laparoscopy, possible exploratory laparotomy possible bowel resection and ostomy creation.  Risk/benefits were discussed with patient and wife, who are in agreement to proceed to the operating room.  - Continue IV antibiotics  - NPO for bowel rest and pre-operatively  - Continue with multimodal analgesia and PRN antiemetics  - Will consult wound care for ostomy marking  - Plan of care and post-operative orders will be placed following the OR today    Hospitalist managing:  -T2DM  - Microcytic anemia  - OSA   - HTN  - Sarcoidosis    Tinnie Pilon, PA-C  General Surgery

## 2024-01-05 NOTE — Wound Image (Signed)
 43 y/o male admit to hospital with abdominal pain related to acute diverticulitis with perforation.  Surgery is planned this afternoon for colectomy and possible ostomy creation.  Stoma sites marked bilateral upper and lower quadrants of abdomen.  Marked away from pant line, within visual field and within rectus muscle.  WOCN team to f/u post surgery as needed.

## 2024-01-06 LAB — COMPREHENSIVE METABOLIC PANEL
ALT: 40 U/L (ref 0–42)
AST: 36 U/L (ref 0–46)
Albumin/Globulin Ratio: 1 (ref 1.00–2.70)
Albumin: 3.3 g/dL — ABNORMAL LOW (ref 3.5–5.2)
Alk Phosphatase: 65 U/L (ref 40–130)
Anion Gap: 9 mmol/L (ref 2–17)
BUN: 21 mg/dL — ABNORMAL HIGH (ref 6–20)
CALCIUM,CORRECTED,CCA: 8.6 mg/dL (ref 8.5–10.7)
CO2: 24 mmol/L (ref 22–29)
Calcium: 8 mg/dL — ABNORMAL LOW (ref 8.5–10.7)
Chloride: 104 mmol/L (ref 98–107)
Creatinine: 1.4 mg/dL — ABNORMAL HIGH (ref 0.7–1.3)
Est, Glom Filt Rate: 63 mL/min/1.73mÂ² (ref >=60)
Globulin: 3.2 g/dL (ref 1.9–4.4)
Glucose: 113 mg/dL — ABNORMAL HIGH (ref 70–99)
Osmolaliy Calculated: 277 mosm/kg (ref 270–287)
Potassium: 4.5 mmol/L (ref 3.5–5.3)
Sodium: 137 mmol/L (ref 135–145)
Total Bilirubin: 0.65 mg/dL (ref 0.00–1.20)
Total Protein: 6.5 g/dL (ref 5.7–8.3)

## 2024-01-06 LAB — CBC WITH AUTO DIFFERENTIAL
Basophils %: 0.1 % (ref 0.0–2.0)
Basophils Absolute: 0 x10e3/mcL (ref 0.0–0.2)
Eosinophils %: 0 % (ref 0.0–7.0)
Eosinophils Absolute: 0 x10e3/mcL (ref 0.0–0.5)
Hematocrit: 39.6 % (ref 38.0–52.0)
Hemoglobin: 12.2 g/dL — ABNORMAL LOW (ref 13.0–17.3)
Immature Grans (Abs): 0.02 x10e3/mcL (ref 0.00–0.06)
Immature Granulocytes %: 0.2 % (ref 0.0–0.6)
Lymphocytes Absolute: 0.8 x10e3/mcL — ABNORMAL LOW (ref 1.0–3.2)
Lymphocytes: 7.8 % — ABNORMAL LOW (ref 15.0–45.0)
MCH: 23 pg — ABNORMAL LOW (ref 27.0–34.5)
MCHC: 30.8 g/dL (ref 30.0–36.0)
MCV: 74.6 fL — ABNORMAL LOW (ref 84.0–100.0)
MPV: 9.9 fL (ref 7.0–12.2)
Monocytes %: 4.9 % (ref 4.0–12.0)
Monocytes Absolute: 0.5 x10e3/mcL (ref 0.3–1.0)
NRBC Absolute: 0 x10e3/mcL (ref 0.000–0.012)
NRBC Automated: 0 % (ref 0.0–0.2)
Neutrophils %: 87 % — ABNORMAL HIGH (ref 42.0–74.0)
Neutrophils Absolute: 9.1 x10e3/mcL — ABNORMAL HIGH (ref 1.6–7.3)
Platelets: 201 x10e3/mcL (ref 140–440)
RBC: 5.31 x10e6/mcL (ref 4.00–5.60)
RDW: 16.3 % (ref 10.0–17.0)
WBC: 10.4 x10e3/mcL (ref 3.8–10.6)

## 2024-01-06 LAB — POCT GLUCOSE
POC Glucose: 111 mg/dL — ABNORMAL HIGH (ref 65.0–110.0)
POC Glucose: 109 mg/dL (ref 65.0–110.0)
POC Glucose: 103 mg/dL (ref 65.0–110.0)

## 2024-01-06 LAB — CULTURE, BLOOD 1

## 2024-01-06 LAB — MAGNESIUM: Magnesium: 2.3 mg/dL (ref 1.6–2.6)

## 2024-01-06 MED ORDER — ACETAMINOPHEN 325 MG PO TABS
325 | Freq: Four times a day (QID) | ORAL | Status: DC
Start: 2024-01-06 — End: 2024-01-09
  Administered 2024-01-06 – 2024-01-09 (×13): 650 mg via ORAL

## 2024-01-06 MED ORDER — KETOROLAC TROMETHAMINE 15 MG/ML IJ SOLN
15 | Freq: Four times a day (QID) | INTRAMUSCULAR | Status: DC
Start: 2024-01-06 — End: 2024-01-07
  Administered 2024-01-06 – 2024-01-07 (×5): 15 mg via INTRAVENOUS

## 2024-01-06 MED ORDER — GABAPENTIN 100 MG PO CAPS
100 | Freq: Three times a day (TID) | ORAL | Status: DC
Start: 2024-01-06 — End: 2024-01-09
  Administered 2024-01-06 – 2024-01-09 (×12): 200 mg via ORAL

## 2024-01-06 MED ORDER — HYDROMORPHONE HCL 1 MG/ML IJ SOLN
1 | INTRAMUSCULAR | Status: DC | PRN
Start: 2024-01-06 — End: 2024-01-09
  Administered 2024-01-06: 09:00:00 1 mg via INTRAVENOUS

## 2024-01-06 MED ORDER — METHOCARBAMOL 500 MG PO TABS
500 | Freq: Four times a day (QID) | ORAL | Status: DC
Start: 2024-01-06 — End: 2024-01-09
  Administered 2024-01-06 – 2024-01-09 (×14): 1000 mg via ORAL

## 2024-01-06 MED ORDER — INSULIN LISPRO 100 UNIT/ML IJ SOLN
100 | Freq: Four times a day (QID) | INTRAMUSCULAR | Status: DC
Start: 2024-01-06 — End: 2024-01-09

## 2024-01-06 MED ORDER — PHENOL 1.4 % MT LIQD
1.4 | OROMUCOSAL | Status: DC | PRN
Start: 2024-01-06 — End: 2024-01-09
  Administered 2024-01-06: 04:00:00 1 via OROMUCOSAL

## 2024-01-06 MED ORDER — HYDROMORPHONE HCL 1 MG/ML IJ SOLN
1 | INTRAMUSCULAR | Status: DC | PRN
Start: 2024-01-06 — End: 2024-01-09
  Administered 2024-01-08 – 2024-01-09 (×3): 0.5 mg via INTRAVENOUS

## 2024-01-06 MED FILL — NORMAL SALINE FLUSH 0.9 % IV SOLN: 0.9 % | INTRAVENOUS | Qty: 40 | Fill #0

## 2024-01-06 MED FILL — TYLENOL 325 MG PO TABS: 325 mg | ORAL | Qty: 2 | Fill #0

## 2024-01-06 MED FILL — KETOROLAC TROMETHAMINE 15 MG/ML IJ SOLN: 15 mg/mL | INTRAMUSCULAR | Qty: 1 | Fill #0

## 2024-01-06 MED FILL — DILAUDID 1 MG/ML IJ SOLN: 1 mg/mL | INTRAMUSCULAR | Qty: 1 | Fill #0

## 2024-01-06 MED FILL — METHOCARBAMOL 500 MG PO TABS: 500 mg | ORAL | Qty: 2 | Fill #0

## 2024-01-06 MED FILL — GABAPENTIN 100 MG PO CAPS: 100 mg | ORAL | Qty: 2 | Fill #0

## 2024-01-06 MED FILL — PIPERACILLIN SOD-TAZOBACTAM SO 4.5 (4-0.5) G IV SOLR: 4.5 (4-0.5) g | INTRAVENOUS | Qty: 4500 | Fill #0

## 2024-01-06 MED FILL — CHLORASEPTIC 1.4 % MT LIQD: 1.4 % | OROMUCOSAL | Qty: 177 | Fill #0

## 2024-01-06 NOTE — Progress Notes (Signed)
 General Surgery Progress Note    Subjective:  POD1 s/p diagnostic laparoscopy converted to exploratory laparotomy with sigmoid resection, colorectal anastomosis, and creation of loop ileostomy.     Seen and examined at bedside this morning.  Patient is up sitting in chair, his wife is at the bedside with him.  He states he is feeling well this morning, his pain is well-controlled.  Doing much better than preoperatively.  Is having output from his ileostomy, no nausea or vomiting.  States he is ready to get up and walk around.  Afebrile, nontachycardic.    Objective:    Vital Signs:  Blood pressure 120/77, pulse 72, temperature 98.6 F (37 C), temperature source Oral, resp. rate 17, height 1.651 m (5' 5), weight 117 kg (258 lb), SpO2 95%.    Physical Exam:   General: Awake, Well-Nourished, Non-toxic.  Neuro: A&Ox4. Grossly intact.  Cardio: HRR  Resp: Non-labored, Even.  GI: Softly distended.  Appropriately TTP around incision sites.  Nonperitoneal.  Stoma is pink and patent, brown liquid stool noted in ostomy bag.  Peripheral: No edema.  Skin: Midline dressing in place, minimal strikethrough.  Dry and intact.  Psych: Cooperative, Appropriate Mood and affect.  Drains: JP drain in place.  120 cc of output documented since time of surgery.  About 20 cc of serosanguineous fluid noted in bulb.    I/O last 3 completed shifts:  In: 4551.7 [I.V.:4451.7; IV Piggyback:100]  Out: 1670 [Urine:950; Emesis/NG output:600; Drains:120]  No intake/output data recorded.    Labs:  CBC:  Recent Labs     01/05/24  0202 01/05/24  0624 01/06/24  0414   WBC 10.0 8.6 10.4   RBC 6.05* 5.54 5.31   HGB 14.0 13.0 12.2*   HCT 44.2 40.7 39.6   MCV 73.1* 73.5* 74.6*   RDW 15.9 15.9 16.3   PLT 224 226 201     CHEMISTRIES:  Recent Labs     01/05/24  0202 01/05/24  0624 01/06/24  0414   NA 136 138 137   K 4.2 4.2 4.5   CL 99 101 104   CO2 24 25 24    BUN 16 17 21*   CREATININE 1.2 1.2 1.4*   GLUCOSE 129* 114* 113*   MG 2.2  --  2.3     LIVER  PROFILE:  Recent Labs     01/05/24  0202 01/05/24  0624 01/06/24  0414   AST 28 22 36   ALT 39 33 40   BILITOT 0.82 1.07 0.65   ALKPHOS 69 68 65     COAG:  PT/INR:  Recent Labs     01/05/24  0624   PROTIME 14.5   INR 1.1*     APTT:No results for input(s): APTT in the last 72 hours.    Imaging/Diagnostics   CT ABDOMEN PELVIS W IV CONTRAST Additional Contrast? None  Result Date: 01/05/2024  1. Perforated sigmoid diverticulitis with focal contained free air adjacent sigmoid mesocolon. No drainable abscess currently. 2. Hepatic steatosis. Small hiatal hernia. Initial report by [facility].      ASSESSMENT AND PLAN:  43 year old male with a past medical history of obesity, T2DM, HTN who presented to the emergency department on 7/25 with complaints of abdominal pain and nausea. Workup in the emergency department included a CTAP which demonstrated contained free air without abscess. He was admitted to the medical service and general surgery was consulted for further evaluation.  He was taken to the operating room on  7/25 for intervention.    POD 1 s/p diagnostic laparoscopy converted to exploratory laparotomy with sigmoid resection, colorectal anastomosis, and creation of loop ileostomy    Perforated diverticulitis  - Chart / labs reviewed.  WBC is 10.4 K.  Hemoglobin stable at 12.2.  Magnesium  and potassium within normal limits.  A bit of a bump in his BUN/creatinine.  Creatinine is 1.4 this morning.  - Doing well from a surgical perspective.  He is already having output from his ileostomy.  We removed the NG tube this morning.  Will start him on sips of clears for now and see how he does.  - Remove Foley.  Void trial.  - As needed electrolyte replacement per primary  - Continue IVF for now.  - Continue IV antibiotics: Zosyn   -Continue JP drain to bulb suction.  Monitor output  - Encourage OOB and ambulation  -Will leave midline dressing intact.  Plan to remove on POD 2.  - Multimodal analgesia and as needed  antiemetics  - Encourage incentive spirometer use    2.  Obesity  - Impacts all aspects of medical care and increases risk of post-operative complications.     DVT ppx: SCDs, okay to start chemical ppx from a surgical perspective    Hospitalist managing:  -T2DM  - Microcytic anemia  - OSA   - HTN  - Sarcoidosis     Tinnie Pilon, PA-C  General Surgery

## 2024-01-06 NOTE — Progress Notes (Signed)
 [facility] Hospitalist Service     Hospitalist Progress Note     PCP: Patton Lauraine HERO, MD   Admission Date: 01/05/2024        Subjective   Overnight activity reviewed with nursing staff.  Patient is doing much better this morning.  No observed walking in the halls.  He states he has not had any fevers and had his NG tube taken out this morning.  He states his pain is well-controlled and is only present when he is abdomen is palpated.      Objective   Blood pressure 131/79, pulse 76, temperature 98.5 F (36.9 C), temperature source Oral, resp. rate 16, height 1.651 m (5' 5), weight 117 kg (258 lb), SpO2 93%.   Temp (24hrs), Avg:98.9 F (37.2 C), Min:98.2 F (36.8 C), Max:99.6 F (37.6 C)    Body mass index is 42.93 kg/m.      Intake/Output Summary (Last 24 hours) at 01/06/2024 1314  Last data filed at 01/06/2024 1137  Gross per 24 hour   Intake 4551.66 ml   Output 1800 ml   Net 2751.66 ml         Physical Exam:  General: Alert, cooperative, non-toxic appearing, walking  Lungs: Clear to auscultation without wheeze rales or ronchi. Breathing is non-labored  Heart: Normal rate, regular rhythm, no murmur, gallop or edema  Musculoskeletal: Warm, well perfused, no joint swelling or tenderness, no pitting edema  Abdomen: Soft, midline incision dressing in place, bulb with serosanguineous drainage, tender to palpation around surgical site, ostomy with liquid green stool  Neuro: No tremor  Mental Status: Alert & oriented x 3     Labs and Imaging     Data: I have personally reviewed all lab results and independently reviewed imaging studies performed in the past 24 hours.    Hematologic/Coags Chemistries   Recent Labs     01/05/24  0202 01/05/24  0624 01/06/24  0414   WBC 10.0 8.6 10.4   HGB 14.0 13.0 12.2*   HCT 44.2 40.7 39.6   PLT 224 226 201     Lab Results   Component Value Date/Time    ALBUMIN 3.3 01/06/2024 04:14 AM     No components found for: HGBA1C  Lab Results   Component Value Date/Time    INR 1.1 01/05/2024  06:24 AM    PROTIME 14.5 01/05/2024 06:24 AM     No results found for: APTT  No results found for: DDIMER   Recent Labs     01/05/24  0202 01/05/24  0624 01/06/24  0414   NA 136 138 137   K 4.2 4.2 4.5   CL 99 101 104   CO2 24 25 24    BUN 16 17 21*   CREATININE 1.2 1.2 1.4*   PROTIME  --  14.5  --    ALBUMIN 4.2 4.0 3.3*   BILITOT 0.82 1.07 0.65   ALKPHOS 69 68 65   AST 28 22 36   ALT 39 33 40     No results for input(s): GLU in the last 72 hours.  No results found for: CPK, CKMB, TROPONINI  Lab Results   Component Value Date/Time    IRON 91 01/25/2019 09:48 AM    FERRITIN 215.8 01/25/2019 09:48 AM        Inflammatory/Respiratory Diabetes   No results found for: CRP  No results found for: ESR  ABGs:  No results found for: PHART, PO2ART, HCO3, PCO2ART   Lab Results  Component Value Date/Time    LDL 156.2 06/17/2022 12:30 PM    CREATININE 1.4 01/06/2024 04:14 AM              Studies:  No orders to display       No results found for this or any previous visit.      Medications     . piperacillin -tazobactam  4,500 mg IntraVENous Q8H   . sodium chloride  flush  5-40 mL IntraVENous 2 times per day   . insulin  lispro  0-8 Units SubCUTAneous 4 times per day   . sodium chloride  flush  5-40 mL IntraVENous 2 times per day   . acetaminophen   650 mg Oral 4 times per day   . ketorolac   15 mg IntraVENous Q6H   . gabapentin   200 mg Oral TID   . methocarbamol   1,000 mg Oral 4x Daily      . sodium chloride      . dextrose      . sodium chloride  100 mL/hr at 01/06/24 0149   . sodium chloride         (Note: the above list excludes PRNs)    Assessment & Plan     Hospital Problems           Last Modified POA    * (Principal) Diverticulitis of colon with perforation 01/05/2024 Yes    Intestinal obstruction (HCC) 01/06/2024 Yes        #Acute perforated diverticulitis:  #Sepsis:  Patient meets sepsis criteria with heart rate greater than 90 and fever.  WBC 10 with neutrophil predominance.  Lactate within normal limits x  2.  Blood pressure slightly elevated.  CT abdomen pelvis was concerning for perforated sigmoid diverticulitis with surrounding free air and stranding.   Lactate, white blood cell count still remains within normal limits.  - General surgery following, appreciate assistance   - S/p diagnostic laparotomy converted to explore laparotomy w/ sigmoid resection, colorectal anastomosis, and creation of loop ileostomy    -Doing well from surgical subjective, remove NG tube, start on sips of clears, advance diet as tolerated  - Continue IV Zosyn    - Continuous normal saline 100 cc/hour  - As needed IV Dilaudid  for pain, as needed IV Zofran  for nausea    #AKI: Creatinine slightly better 1.4, likely prerenal  - IVF as above  - Avoiding nephrotoxic meds    #Hypertension:  - Holding home losartan  in setting of AKI as above     #T2DM:  Only on metformin  at home  - Hold metformin  and place on sliding scale insulin  with every 6 hour glucose checks while n.p.o.     #Morbid obesity with BMI 40-45:  - Complicates all aspects of care and increases surgical risk     #OSA:  - Continue nightly BiPAP     #Daily alcohol use:  Patient drinks about 3 alcoholic beverages daily.  Patient denies any history of alcohol withdrawal.  Does not appear to be withdrawing at present.  -Will watch on CIWA      Diet NPO Exceptions are: Sips of Clear Liquids   Full Code  DVT ppx:  SCD's or Sequential Compression Device  Dispo: Continue inpatient management for perforated diverticulitis, sepsis,   Aliene ONEIDA Ada, MD  01/06/2024 1:14 PM  [facility] Hospitalist Service    ++++++++++++++++++++++++++++++++++++++++    This note was created using voice recognition software and may contain typographic errors missed during final review. The intent is to have a complete and accurate medical  record.   As a valued partner in this safety effort, if you have noted factual errors, please contact the [facility] Hospitalist Service at [phone].

## 2024-01-06 NOTE — Plan of Care (Signed)
 Problem: Chronic Conditions and Co-morbidities  Goal: Patient's chronic conditions and co-morbidity symptoms are monitored and maintained or improved  01/06/2024 1039 by Massie Inocente Sevin, RN  Outcome: Progressing  01/06/2024 0227 by Marsa Danetta Grumet, RN  Outcome: Progressing  Flowsheets (Taken 01/06/2024 0227)  Care Plan - Patient's Chronic Conditions and Co-Morbidity Symptoms are Monitored and Maintained or Improved:   Monitor and assess patient's chronic conditions and comorbid symptoms for stability, deterioration, or improvement   Update acute care plan with appropriate goals if chronic or comorbid symptoms are exacerbated and prevent overall improvement and discharge   Collaborate with multidisciplinary team to address chronic and comorbid conditions and prevent exacerbation or deterioration     Problem: Discharge Planning  Goal: Discharge to home or other facility with appropriate resources  01/06/2024 1039 by Massie Inocente Sevin, RN  Outcome: Progressing  01/06/2024 0227 by Marsa Danetta Grumet, RN  Outcome: Progressing  Flowsheets (Taken 01/06/2024 0227)  Discharge to home or other facility with appropriate resources:   Identify barriers to discharge with patient and caregiver   Arrange for needed discharge resources and transportation as appropriate   Identify discharge learning needs (meds, wound care, etc)   Arrange for interpreters to assist at discharge as needed   Refer to discharge planning if patient needs post-hospital services based on physician order or complex needs related to functional status, cognitive ability or social support system     Problem: ABCDS Injury Assessment  Goal: Absence of physical injury  01/06/2024 1039 by Massie Inocente Sevin, RN  Outcome: Progressing  01/06/2024 0227 by Marsa Danetta Grumet, RN  Outcome: Progressing  Flowsheets (Taken 01/06/2024 0227)  Absence of Physical Injury: Implement safety measures based on patient assessment     Problem: Seizure  Precautions  Goal: Remains free of injury related to seizures activity  01/06/2024 1039 by Massie Inocente Sevin, RN  Outcome: Progressing  01/06/2024 0227 by Marsa Danetta Grumet, RN  Outcome: Progressing  Flowsheets (Taken 01/06/2024 0227)  Remains free of injury related to seizure activity:   Maintain airway, patient safety  and administer oxygen as ordered   Monitor patient for seizure activity, document and report duration and description of seizure to Licensed Independent Practitioner   If seizure occurs, turn patient to side and suction secretions as needed   Reorient patient post seizure   Seizure pads on all 4 side rails   Instruct patient/family to notify RN of any seizure activity   Instruct patient/family to call for assistance with activity based on assessment     Problem: Pain  Goal: Verbalizes/displays adequate comfort level or baseline comfort level  01/06/2024 1039 by Massie Inocente Sevin, RN  Outcome: Progressing  01/06/2024 0227 by Marsa Danetta Grumet, RN  Outcome: Progressing  Flowsheets (Taken 01/06/2024 0227)  Verbalizes/displays adequate comfort level or baseline comfort level:   Encourage patient to monitor pain and request assistance   Assess pain using appropriate pain scale   Administer analgesics based on type and severity of pain and evaluate response   Implement non-pharmacological measures as appropriate and evaluate response   Consider cultural and social influences on pain and pain management   Notify Licensed Independent Practitioner if interventions unsuccessful or patient reports new pain

## 2024-01-06 NOTE — Plan of Care (Signed)
 Problem: Chronic Conditions and Co-morbidities  Goal: Patient's chronic conditions and co-morbidity symptoms are monitored and maintained or improved  01/06/2024 2206 by Emmitt Mont MATSU, RN  Outcome: Progressing  01/06/2024 1039 by Massie Inocente Sevin, RN  Outcome: Progressing     Problem: Discharge Planning  Goal: Discharge to home or other facility with appropriate resources  01/06/2024 2206 by Emmitt Mont MATSU, RN  Outcome: Progressing  01/06/2024 1039 by Massie Inocente Sevin, RN  Outcome: Progressing     Problem: ABCDS Injury Assessment  Goal: Absence of physical injury  01/06/2024 2206 by Emmitt Mont MATSU, RN  Outcome: Progressing  01/06/2024 1039 by Massie Inocente Sevin, RN  Outcome: Progressing     Problem: Seizure Precautions  Goal: Remains free of injury related to seizures activity  01/06/2024 2206 by Emmitt Mont MATSU, RN  Outcome: Progressing  01/06/2024 1039 by Massie Inocente Sevin, RN  Outcome: Progressing     Problem: Pain  Goal: Verbalizes/displays adequate comfort level or baseline comfort level  01/06/2024 2206 by Emmitt Mont MATSU, RN  Outcome: Progressing  01/06/2024 1039 by Massie Inocente Sevin, RN  Outcome: Progressing

## 2024-01-07 LAB — CBC WITH AUTO DIFFERENTIAL
Basophils %: 0 % (ref 0.0–2.0)
Basophils Absolute: 0 x10e3/mcL (ref 0.0–0.2)
Eosinophils %: 0.1 % (ref 0.0–7.0)
Eosinophils Absolute: 0 x10e3/mcL (ref 0.0–0.5)
Hematocrit: 36.6 % — ABNORMAL LOW (ref 38.0–52.0)
Hemoglobin: 11.4 g/dL — ABNORMAL LOW (ref 13.0–17.3)
Immature Grans (Abs): 0.02 x10e3/mcL (ref 0.00–0.06)
Immature Granulocytes %: 0.2 % (ref 0.0–0.6)
Lymphocytes Absolute: 1.4 x10e3/mcL (ref 1.0–3.2)
Lymphocytes: 16 % (ref 15.0–45.0)
MCH: 22.8 pg — ABNORMAL LOW (ref 27.0–34.5)
MCHC: 31.1 g/dL (ref 30.0–36.0)
MCV: 73.2 fL — ABNORMAL LOW (ref 84.0–100.0)
MPV: 10.4 fL (ref 7.0–12.2)
Monocytes %: 7.2 % (ref 4.0–12.0)
Monocytes Absolute: 0.6 x10e3/mcL (ref 0.3–1.0)
NRBC Absolute: 0 x10e3/mcL (ref 0.000–0.012)
NRBC Automated: 0 % (ref 0.0–0.2)
Neutrophils %: 76.5 % — ABNORMAL HIGH (ref 42.0–74.0)
Neutrophils Absolute: 6.6 x10e3/mcL (ref 1.6–7.3)
Platelets: 222 x10e3/mcL (ref 140–440)
RBC: 5 x10e6/mcL (ref 4.00–5.60)
RDW: 16.3 % (ref 10.0–17.0)
WBC: 8.7 x10e3/mcL (ref 3.8–10.6)

## 2024-01-07 LAB — POCT GLUCOSE
POC Glucose: 86 mg/dL (ref 65.0–110.0)
POC Glucose: 89 mg/dL (ref 65.0–110.0)
POC Glucose: 81 mg/dL (ref 65.0–110.0)
POC Glucose: 74 mg/dL (ref 65.0–110.0)

## 2024-01-07 LAB — MAGNESIUM: Magnesium: 2.9 mg/dL — ABNORMAL HIGH (ref 1.6–2.6)

## 2024-01-07 LAB — COMPREHENSIVE METABOLIC PANEL
ALT: 36 U/L (ref 0–42)
AST: 37 U/L (ref 0–46)
Albumin/Globulin Ratio: 0.9 — ABNORMAL LOW (ref 1.00–2.70)
Albumin: 2.9 g/dL — ABNORMAL LOW (ref 3.5–5.2)
Alk Phosphatase: 67 U/L (ref 40–130)
Anion Gap: 9 mmol/L (ref 2–17)
BUN: 32 mg/dL — ABNORMAL HIGH (ref 6–20)
CALCIUM,CORRECTED,CCA: 9.6 mg/dL (ref 8.5–10.7)
CO2: 22 mmol/L (ref 22–29)
Calcium: 8.7 mg/dL (ref 8.5–10.7)
Chloride: 105 mmol/L (ref 98–107)
Creatinine: 1.4 mg/dL — ABNORMAL HIGH (ref 0.7–1.3)
Est, Glom Filt Rate: 63 mL/min/1.73m (ref >=60)
Globulin: 3.4 g/dL (ref 1.9–4.4)
Glucose: 92 mg/dL (ref 70–99)
Osmolaliy Calculated: 278 mosm/kg (ref 270–287)
Potassium: 4.5 mmol/L (ref 3.5–5.3)
Sodium: 136 mmol/L (ref 135–145)
Total Bilirubin: 0.46 mg/dL (ref 0.00–1.20)
Total Protein: 6.3 g/dL (ref 5.7–8.3)

## 2024-01-07 MED ORDER — LOPERAMIDE HCL 2 MG PO CAPS
2 | Freq: Three times a day (TID) | ORAL | Status: DC
Start: 2024-01-07 — End: 2024-01-09
  Administered 2024-01-07 – 2024-01-09 (×6): 2 mg via ORAL

## 2024-01-07 MED ORDER — SODIUM CHLORIDE 0.9 % IV SOLN
0.9 | INTRAVENOUS | Status: DC
Start: 2024-01-07 — End: 2024-01-08
  Administered 2024-01-07: 16:00:00 via INTRAVENOUS

## 2024-01-07 MED FILL — METHOCARBAMOL 500 MG PO TABS: 500 mg | ORAL | Qty: 2 | Fill #0

## 2024-01-07 MED FILL — KETOROLAC TROMETHAMINE 15 MG/ML IJ SOLN: 15 mg/mL | INTRAMUSCULAR | Qty: 1 | Fill #0

## 2024-01-07 MED FILL — LOPERAMIDE HCL 2 MG PO CAPS: 2 mg | ORAL | Qty: 1 | Fill #0

## 2024-01-07 MED FILL — TYLENOL 325 MG PO TABS: 325 mg | ORAL | Qty: 2 | Fill #0

## 2024-01-07 MED FILL — NORMAL SALINE FLUSH 0.9 % IV SOLN: 0.9 % | INTRAVENOUS | Qty: 20 | Fill #0

## 2024-01-07 MED FILL — GABAPENTIN 100 MG PO CAPS: 100 mg | ORAL | Qty: 2 | Fill #0

## 2024-01-07 MED FILL — PIPERACILLIN SOD-TAZOBACTAM SO 4.5 (4-0.5) G IV SOLR: 4.5 (4-0.5) g | INTRAVENOUS | Qty: 4500 | Fill #0

## 2024-01-07 NOTE — Progress Notes (Signed)
 [facility] Hospitalist Service     Hospitalist Progress Note     PCP: Patton Lauraine HERO, MD   Admission Date: 01/05/2024        Subjective   Overnight activity reviewed with nursing staff.  Patient is doing great today.  He reports he slept on and off last night and has been eating ice chips.  He and his wife have been walking around the unit.    Objective   Blood pressure 136/85, pulse 55, temperature 98.6 F (37 C), temperature source Oral, resp. rate 16, height 1.651 m (5' 5), weight 117 kg (258 lb), SpO2 97%.   Temp (24hrs), Avg:98.4 F (36.9 C), Min:98.2 F (36.8 C), Max:98.6 F (37 C)    Body mass index is 42.93 kg/m.      Intake/Output Summary (Last 24 hours) at 01/07/2024 1122  Last data filed at 01/07/2024 1044  Gross per 24 hour   Intake 1511.67 ml   Output 2935 ml   Net -1423.33 ml         Physical Exam:  General: Alert, cooperative, non-toxic appearing, walking  Lungs: Clear to auscultation without wheeze rales or ronchi. Breathing is non-labored  Heart: Normal rate, regular rhythm, no murmur, gallop or edema  Musculoskeletal: Warm, well perfused, no joint swelling or tenderness, no pitting edema  Abdomen: Soft, midline incision dressing in place, bulb with serosanguineous drainage, tender to palpation around surgical site,  Neuro: No tremor  Mental Status: Alert & oriented x 3     Labs and Imaging     Data: I have personally reviewed all lab results and independently reviewed imaging studies performed in the past 24 hours.    Hematologic/Coags Chemistries   Recent Labs     01/05/24  0202 01/05/24  0624 01/06/24  0414 01/07/24  0546   WBC 10.0 8.6 10.4 8.7   HGB 14.0 13.0 12.2* 11.4*   HCT 44.2 40.7 39.6 36.6*   PLT 224 226 201 222     Lab Results   Component Value Date/Time    ALBUMIN 2.9 01/07/2024 05:46 AM     No components found for: HGBA1C  Lab Results   Component Value Date/Time    INR 1.1 01/05/2024 06:24 AM    PROTIME 14.5 01/05/2024 06:24 AM     No results found for: APTT  No results found  for: DDIMER   Recent Labs     01/05/24  0624 01/06/24  0414 01/07/24  0546   NA 138 137 136   K 4.2 4.5 4.5   CL 101 104 105   CO2 25 24 22    BUN 17 21* 32*   CREATININE 1.2 1.4* 1.4*   PROTIME 14.5  --   --    ALBUMIN 4.0 3.3* 2.9*   BILITOT 1.07 0.65 0.46   ALKPHOS 68 65 67   AST 22 36 37   ALT 33 40 36     No results for input(s): GLU in the last 72 hours.  No results found for: CPK, CKMB, TROPONINI  Lab Results   Component Value Date/Time    IRON 91 01/25/2019 09:48 AM    FERRITIN 215.8 01/25/2019 09:48 AM        Inflammatory/Respiratory Diabetes   No results found for: CRP  No results found for: ESR  ABGs:  No results found for: PHART, PO2ART, HCO3, PCO2ART   Lab Results   Component Value Date/Time    LDL 156.2 06/17/2022 12:30 PM    CREATININE  1.4 01/07/2024 05:46 AM              Studies:  No orders to display       No results found for this or any previous visit.      Medications     . loperamide   2 mg Oral TID   . insulin  lispro  0-4 Units SubCUTAneous 4 times per day   . piperacillin -tazobactam  4,500 mg IntraVENous Q8H   . sodium chloride  flush  5-40 mL IntraVENous 2 times per day   . sodium chloride  flush  5-40 mL IntraVENous 2 times per day   . acetaminophen   650 mg Oral 4 times per day   . gabapentin   200 mg Oral TID   . methocarbamol   1,000 mg Oral 4x Daily      . sodium chloride      . sodium chloride      . dextrose      . sodium chloride         (Note: the above list excludes PRNs)    Assessment & Plan     Hospital Problems           Last Modified POA    * (Principal) Diverticulitis of colon with perforation 01/05/2024 Yes    Intestinal obstruction (HCC) 01/06/2024 Yes        #Acute perforated diverticulitis:  #Sepsis:  Patient meets sepsis criteria with heart rate greater than 90 and fever.  WBC 10 with neutrophil predominance.  Lactate within normal limits x 2.  Blood pressure slightly elevated.  CT abdomen pelvis was concerning for perforated sigmoid diverticulitis with  surrounding free air and stranding.   Lactate, white blood cell count still remains within normal limits.  - General surgery following, appreciate assistance   - S/p diagnostic laparotomy converted to explore laparotomy w/ sigmoid resection, colorectal anastomosis, and creation of loop ileostomy    -Doing well from surgical subjective, starting clears, hopeful advance to regular diet this afternoon   -Have added banana flakes to help thicken stools  - Continue IV Zosyn    - Continuous normal saline 100 cc/hour  - As needed IV Dilaudid  for pain, as needed IV Zofran  for nausea    #AKI: Creatinine slightly better 1.4, likely prerenal  - IVF as above  - Avoiding nephrotoxic meds    #Hypertension:  - Holding home losartan  in setting of AKI as above     #T2DM:  Only on metformin  at home  - Hold metformin  and place on sliding scale insulin  with every 6 hour glucose checks while n.p.o.     #Morbid obesity with BMI 40-45:  - Complicates all aspects of care and increases surgical risk     #OSA:  - Continue nightly BiPAP     #Daily alcohol use:  Patient drinks about 3 alcoholic beverages daily.  Patient denies any history of alcohol withdrawal.  Does not appear to be withdrawing at present.  -Will watch on CIWA      ADULT DIET; Clear Liquid  ADULT ORAL NUTRITION SUPPLEMENT; Breakfast, Lunch, Dinner; Other Oral Supplement; Banana flakes   Full Code  DVT ppx:  SCD's or Sequential Compression Device  Dispo: Continue inpatient management for perforated diverticulitis, sepsis,   Aliene ONEIDA Ada, MD  01/07/2024 11:22 AM  [facility] Hospitalist Service    ++++++++++++++++++++++++++++++++++++++++    This note was created using voice recognition software and may contain typographic errors missed during final review. The intent is to have a complete and accurate medical record.  As a valued partner in this safety effort, if you have noted factual errors, please contact the [facility] Hospitalist Service at [phone].

## 2024-01-07 NOTE — Progress Notes (Signed)
 General Surgery Progress Note    Subjective:  POD 2 s/p diagnostic laparoscopy converted to exploratory laparotomy with sigmoid resection, colorectal anastomosis, creation of loop ileostomy.    Patient continues to feel well.  He was up walking around in the halls this morning.  Now sitting in the chair, his pain is well-controlled.  Ileostomy functioning, with liquid stool in bag.  He had 1.5 mL out yesterday.  Denies any nausea or vomiting.  Afebrile, nontachycardic.    Objective:    Vital Signs:  Blood pressure 136/85, pulse 55, temperature 98.6 F (37 C), temperature source Oral, resp. rate 16, height 1.651 m (5' 5), weight 117 kg (258 lb), SpO2 97%.    Physical Exam:   General: Awake, Well-Nourished, Non-toxic.  Neuro: A&Ox4. Grossly intact.  Cardio: HRR  Resp: Non-labored, Even.  GI: Softly distended.  Appropriately TTP around incision sites.  Nonperitoneal.  Stoma is pink and patent, brown liquid stool noted in ostomy bag.  Peripheral: No edema.  Skin: Midline dressing in place, minimal strikethrough.  Dry and intact.  Psych: Cooperative, Appropriate Mood and affect.  Drains: JP drain in place.  110 cc of output documented yesterday in 24 hrs. Serosang in nature    I/O last 3 completed shifts:  In: 2203.3 [P.O.:120; I.V.:1983.3; IV Piggyback:100]  Out: 3800 [Urine:1455; Emesis/NG output:600; Drains:180; Stool:1565]  I/O this shift:  In: -   Out: 545 [Urine:110; Drains:15; Stool:420]    Labs:  CBC:  Recent Labs     01/05/24  0624 01/06/24  0414 01/07/24  0546   WBC 8.6 10.4 8.7   RBC 5.54 5.31 5.00   HGB 13.0 12.2* 11.4*   HCT 40.7 39.6 36.6*   MCV 73.5* 74.6* 73.2*   RDW 15.9 16.3 16.3   PLT 226 201 222     CHEMISTRIES:  Recent Labs     01/05/24  0202 01/05/24  0624 01/06/24  0414 01/07/24  0546   NA 136 138 137 136   K 4.2 4.2 4.5 4.5   CL 99 101 104 105   CO2 24 25 24 22    BUN 16 17 21* 32*   CREATININE 1.2 1.2 1.4* 1.4*   GLUCOSE 129* 114* 113* 92   MG 2.2  --  2.3 2.9*     LIVER PROFILE:  Recent Labs      01/05/24  0624 01/06/24  0414 01/07/24  0546   AST 22 36 37   ALT 33 40 36   BILITOT 1.07 0.65 0.46   ALKPHOS 68 65 67     COAG:  PT/INR:  Recent Labs     01/05/24  0624   PROTIME 14.5   INR 1.1*     APTT:No results for input(s): APTT in the last 72 hours.    Imaging/Diagnostics   CT ABDOMEN PELVIS W IV CONTRAST Additional Contrast? None  Result Date: 01/05/2024  1. Perforated sigmoid diverticulitis with focal contained free air adjacent sigmoid mesocolon. No drainable abscess currently. 2. Hepatic steatosis. Small hiatal hernia. Initial report by [facility].      ASSESSMENT AND PLAN:  43 year old male with a past medical history of obesity, T2DM, HTN who presented to the emergency department on 7/25 with complaints of abdominal pain and nausea. Workup in the emergency department included a CTAP which demonstrated contained free air without abscess. He was admitted to the medical service and general surgery was consulted for further evaluation.  He was taken to the operating room on 7/25 for  intervention.     POD2 s/p diagnostic laparoscopy converted to exploratory laparotomy with sigmoid resection, colorectal anastomosis, and creation of loop ileostomy     Perforated diverticulitis  - Chart / labs reviewed.  WBC is 8.7 K.  Hemoglobin is 11.4.  Magnesium  a bit low at 2.9.  Creatinine is 1.4 this morning.  - If he does well with his tray of clears this AM, will hopefully advance to regular this afternoon. Hopefully some solid food will help thicken up the stool  - Ileostomy put out 1.5L of liquid yesterday in 24h. Added some imodium  and banana flakes on board to see if we can thicken this up and slow the output some.   - As needed electrolyte replacement per primary  - Continue IV antibiotics: Zosyn   -Continue JP drain to bulb suction.  Monitor output  - Encourage OOB and ambulation  - Multimodal analgesia and as needed antiemetics. DC Toradol  in light of AKI.   - Encourage incentive spirometer use     2.   Obesity  - Impacts all aspects of medical care and increases risk of post-operative complications.     3. AKI  - Continue IVF for now, likely dry from ostomy output  - DC Toradol   - Avoid nephrotoxic drugs  - Appreciate hospitalist assistance in management     DVT ppx: SCDs, okay to start chemical ppx from a surgical perspective     Hospitalist managing:  -T2DM  - Microcytic anemia  - OSA   - HTN  - Sarcoidosis     Tinnie Pilon, PA-C  General Surgery

## 2024-01-07 NOTE — Plan of Care (Signed)
 Problem: Chronic Conditions and Co-morbidities  Goal: Patient's chronic conditions and co-morbidity symptoms are monitored and maintained or improved  01/07/2024 1046 by Massie Inocente Sevin, RN  Outcome: Progressing  01/06/2024 2206 by Emmitt Mont MATSU, RN  Outcome: Progressing     Problem: Discharge Planning  Goal: Discharge to home or other facility with appropriate resources  01/07/2024 1046 by Massie Inocente Sevin, RN  Outcome: Progressing  01/06/2024 2206 by Emmitt Mont MATSU, RN  Outcome: Progressing     Problem: ABCDS Injury Assessment  Goal: Absence of physical injury  01/07/2024 1046 by Massie Inocente Sevin, RN  Outcome: Progressing  01/06/2024 2206 by Emmitt Mont MATSU, RN  Outcome: Progressing     Problem: Seizure Precautions  Goal: Remains free of injury related to seizures activity  01/07/2024 1046 by Massie Inocente Sevin, RN  Outcome: Progressing  01/06/2024 2206 by Emmitt Mont MATSU, RN  Outcome: Progressing     Problem: Pain  Goal: Verbalizes/displays adequate comfort level or baseline comfort level  01/07/2024 1046 by Massie Inocente Sevin, RN  Outcome: Progressing  01/06/2024 2206 by Emmitt Mont MATSU, RN  Outcome: Progressing

## 2024-01-08 LAB — COMPREHENSIVE METABOLIC PANEL
ALT: 36 U/L (ref 0–42)
AST: 28 U/L (ref 0–46)
Albumin/Globulin Ratio: 0.9 — ABNORMAL LOW (ref 1.00–2.70)
Albumin: 3 g/dL — ABNORMAL LOW (ref 3.5–5.2)
Alk Phosphatase: 59 U/L (ref 40–130)
Anion Gap: 10 mmol/L (ref 2–17)
BUN: 26 mg/dL — ABNORMAL HIGH (ref 6–20)
CALCIUM,CORRECTED,CCA: 9.5 mg/dL (ref 8.5–10.7)
CO2: 21 mmol/L — ABNORMAL LOW (ref 22–29)
Calcium: 8.7 mg/dL (ref 8.5–10.7)
Chloride: 106 mmol/L (ref 98–107)
Creatinine: 1.2 mg/dL (ref 0.7–1.3)
Est, Glom Filt Rate: 76 mL/min/1.73m (ref >=60)
Globulin: 3.3 g/dL (ref 1.9–4.4)
Glucose: 81 mg/dL (ref 70–99)
Osmolaliy Calculated: 277 mosm/kg (ref 270–287)
Potassium: 4 mmol/L (ref 3.5–5.3)
Sodium: 137 mmol/L (ref 135–145)
Total Bilirubin: 0.34 mg/dL (ref 0.00–1.20)
Total Protein: 6.3 g/dL (ref 5.7–8.3)

## 2024-01-08 LAB — MAGNESIUM: Magnesium: 2.5 mg/dL (ref 1.6–2.6)

## 2024-01-08 LAB — POCT GLUCOSE
POC Glucose: 72 mg/dL (ref 65.0–110.0)
POC Glucose: 128 mg/dL — ABNORMAL HIGH (ref 65.0–110.0)
POC Glucose: 75 mg/dL (ref 65.0–110.0)

## 2024-01-08 LAB — CBC WITH AUTO DIFFERENTIAL
Basophils %: 0.3 % (ref 0.0–2.0)
Basophils Absolute: 0 x10e3/mcL (ref 0.0–0.2)
Eosinophils %: 1.2 % (ref 0.0–7.0)
Eosinophils Absolute: 0.1 x10e3/mcL (ref 0.0–0.5)
Hematocrit: 37.1 % — ABNORMAL LOW (ref 38.0–52.0)
Hemoglobin: 11.8 g/dL — ABNORMAL LOW (ref 13.0–17.3)
Immature Grans (Abs): 0.02 x10e3/mcL (ref 0.00–0.06)
Immature Granulocytes %: 0.3 % (ref 0.0–0.6)
Lymphocytes Absolute: 2.1 x10e3/mcL (ref 1.0–3.2)
Lymphocytes: 32.8 % (ref 15.0–45.0)
MCH: 23.1 pg — ABNORMAL LOW (ref 27.0–34.5)
MCHC: 31.8 g/dL (ref 30.0–36.0)
MCV: 72.6 fL — ABNORMAL LOW (ref 84.0–100.0)
MPV: 10.4 fL (ref 7.0–12.2)
Monocytes %: 8.1 % (ref 4.0–12.0)
Monocytes Absolute: 0.5 x10e3/mcL (ref 0.3–1.0)
NRBC Absolute: 0 x10e3/mcL (ref 0.000–0.012)
NRBC Automated: 0 % (ref 0.0–0.2)
Neutrophils %: 57.3 % (ref 42.0–74.0)
Neutrophils Absolute: 3.7 x10e3/mcL (ref 1.6–7.3)
Platelets: 251 x10e3/mcL (ref 140–440)
RBC: 5.11 x10e6/mcL (ref 4.00–5.60)
RDW: 15.9 % (ref 10.0–17.0)
WBC: 6.4 x10e3/mcL (ref 3.8–10.6)

## 2024-01-08 MED FILL — METHOCARBAMOL 500 MG PO TABS: 500 mg | ORAL | Qty: 2 | Fill #0

## 2024-01-08 MED FILL — LOPERAMIDE HCL 2 MG PO CAPS: 2 mg | ORAL | Qty: 1 | Fill #0

## 2024-01-08 MED FILL — TYLENOL 325 MG PO TABS: 325 mg | ORAL | Qty: 2 | Fill #0

## 2024-01-08 MED FILL — NORMAL SALINE FLUSH 0.9 % IV SOLN: 0.9 % | INTRAVENOUS | Qty: 40 | Fill #0

## 2024-01-08 MED FILL — GABAPENTIN 100 MG PO CAPS: 100 mg | ORAL | Qty: 2 | Fill #0

## 2024-01-08 MED FILL — PIPERACILLIN SOD-TAZOBACTAM SO 4.5 (4-0.5) G IV SOLR: 4.5 (4-0.5) g | INTRAVENOUS | Qty: 4500 | Fill #0

## 2024-01-08 MED FILL — DILAUDID 1 MG/ML IJ SOLN: 1 mg/mL | INTRAMUSCULAR | Qty: 0.5 | Fill #0

## 2024-01-08 NOTE — Progress Notes (Signed)
 General Surgery Progress Note    Subjective:  POD3 s/p diagnostic laparoscopy converted to exploratory laparotomy with sigmoid resection, colorectal anastomosis, creation of loop ileostomy.     [name] is still doing quite well post-operatively. He has some abdominal soreness, which he tells me has been improving over the past few days. Tolerating a FLD without any N/V. His ostomy is functioning with about 1.1L out yesterday in 24h. He continues to do well with getting out of bed and walking. He is afebrile and non-tachycardic.     Objective:    Vital Signs:  Blood pressure (!) 155/95, pulse 52, temperature 98.4 F (36.9 C), temperature source Oral, resp. rate 16, height 1.651 m (5' 5), weight 117 kg (258 lb), SpO2 97%.    Physical Exam:   General: Awake, Well-Nourished, Non-toxic.  Neuro: A&Ox4. Grossly intact.  Cardio: HRR  Resp: Non-labored, Even.  GI: Softly distended.  Appropriately TTP around incision sites.  Nonperitoneal.  Stoma is pink and patent, brown liquid stool noted in ostomy bag.  Peripheral: No edema.  Skin: Midline with staples that are c/d/I. Covered with a dressing for comfort.  Dry and intact.  Psych: Cooperative, Appropriate Mood and affect.  Drains: JP drain in place.  175 cc of output documented yesterday in 24 hrs. Serosang in nature    I/O last 3 completed shifts:  In: 360 [P.O.:360]  Out: 3635 [Urine:935; Drains:215; Stool:2485]  I/O this shift:  In: -   Out: 405 [Urine:325; Drains:30; Stool:50]    Labs:  CBC:  Recent Labs     01/06/24  0414 01/07/24  0546 01/08/24  0444   WBC 10.4 8.7 6.4   RBC 5.31 5.00 5.11   HGB 12.2* 11.4* 11.8*   HCT 39.6 36.6* 37.1*   MCV 74.6* 73.2* 72.6*   RDW 16.3 16.3 15.9   PLT 201 222 251     CHEMISTRIES:  Recent Labs     01/06/24  0414 01/07/24  0546 01/08/24  0444   NA 137 136 137   K 4.5 4.5 4.0   CL 104 105 106   CO2 24 22 21*   BUN 21* 32* 26*   CREATININE 1.4* 1.4* 1.2   GLUCOSE 113* 92 81   MG 2.3 2.9* 2.5     LIVER PROFILE:  Recent Labs      01/06/24  0414 01/07/24  0546 01/08/24  0444   AST 36 37 28   ALT 40 36 36   BILITOT 0.65 0.46 0.34   ALKPHOS 65 67 59     COAG:  PT/INR:No results for input(s): PROTIME, INR in the last 72 hours.  APTT:No results for input(s): APTT in the last 72 hours.    Imaging/Diagnostics   CT ABDOMEN PELVIS W IV CONTRAST Additional Contrast? None  Result Date: 01/05/2024  1. Perforated sigmoid diverticulitis with focal contained free air adjacent sigmoid mesocolon. No drainable abscess currently. 2. Hepatic steatosis. Small hiatal hernia. Initial report by [facility].      ASSESSMENT AND PLAN:  42 year old male with a past medical history of obesity, T2DM, HTN who presented to the emergency department on 7/25 with complaints of abdominal pain and nausea. Workup in the emergency department included a CTAP which demonstrated contained free air without abscess. He was admitted to the medical service and general surgery was consulted for further evaluation.  He was taken to the operating room on 7/25 for intervention.     POD3 s/p diagnostic laparoscopy converted to exploratory  laparotomy with sigmoid resection, colorectal anastomosis, and creation of loop ileostomy     Perforated diverticulitis  - Chart / labs reviewed.  WBC is 6.4K.  Hemoglobin is 11.8.  Magnesium  normal at 2.5.  Creatinine is 1.2 this morning.  - Will advance to GI soft this afternoon. Hopefully some solid food will help thicken up the stool  - Ileostomy put out 1.1L of liquid yesterday in 24h. Continue w/ imodium  and banana flakes to control output.  - As needed electrolyte replacement per primary  - Continue IV antibiotics: Zosyn .   - Will have the WOCN team stop in and see him today to help with ostomy education  - Consulted dietician to talk with patient about diet and education.  - Continue JP drain to bulb suction.  Monitor output  - Encourage OOB and ambulation  - Multimodal analgesia and as needed antiemetics.   - Encourage incentive  spirometer use     2.  Obesity  - Impacts all aspects of medical care and increases risk of post-operative complications.      3. AKI  - Resolved. Cr was 1.2 this AM. Likely was pre-renal in nature   - DC IVF   - Avoid nephrotoxic drugs  - Appreciate hospitalist assistance in management     DVT ppx: SCDs, okay to start chemical ppx from a surgical perspective     Hospitalist managing:  -T2DM  - Microcytic anemia  - OSA   - HTN  - Sarcoidosis     Frank Pilon, PA-C  General Surgery

## 2024-01-08 NOTE — Telephone Encounter (Signed)
 Patient is calling requesting Dr Patton give him a call regarding surgery. Please assist

## 2024-01-08 NOTE — Wound Image (Signed)
 S/p diverting ileostomy.  Patient awake and alert.  Eager to observe ostomy appliance change.    WOC visit for stoma assessment and pouch care/teaching.    Incision Intact with staples     Stoma is Beefy red  35mm, support device under loop stoma.  Stoma is flush with skin, but abdomen remains firm and distended.  Coloplast 1 pc flat drainable pouch with barrier ring.  Will likely need convex pouching system once abdominal swelling decreases.    WOC education provided:   - Proper pouch removal to prevent skin stripping.  - Measuring stoma and cutting pouch size accordingly.  - Diet Teaching (stool obstructing, gas producing, stool thickening, stool loosening, and odor producing foods)  -No heavy lifting (to prevent herniation)  -Change pouch every 3-4 days (and PRN for leakage)  - May shower with or without pouch on (only cleanse with soap if showering)  - Do not utilize baby wipes or lotions around stoma as this will disrupt pouch adherence.  - Empty pouch when 1/3-1/2 full.   - Signs of dehydration and how to monitor (monitor urine color - lighter means more hydrated, too dark-drink more fluid; avoid dehydration by drinking 8-10 glasses of water with electrolytes daily and eating stool thickening foods)     WOC to follow up to continue to provide education prior to DC    01/08/24 1000   Ileostomy/Jejunostomy RLQ Loop ileostomy   Placement Date: 01/05/24   Location: RLQ  Ostomy Type: Loop ileostomy   Stomal Appliance 1 piece;Flat   Stoma  Assessment Red   Peristomal Assessment Intact   Mucocutaneous Junction Intact   Treatment Pouch change   Stool Appearance Watery   Stool Color Green;Brown   Output (mL) 200 ml

## 2024-01-08 NOTE — Telephone Encounter (Signed)
 Called spoke with pt advised we are aware he is admitted and had surgery. Advised I cancelled fu appt he had scheduled for tomorrow and once he is d/c we will will reach out to get him added to the schedule. He stated they are thinking possibly tomorrow depending on how his bowels are. He wanted to discuss labs prior to being admitted will discuss at fu after being discharged

## 2024-01-08 NOTE — Care Coordination-Inpatient (Signed)
 01/08/24 CM: Pod #3 s/p Ex-lap with sigmoid resection and loop ileostomy. DCP: Pt will likely need HH/SN at dc. Pt has no preference and agreeable to [facility] at dc. Referral sent via epic. CM following for DC updates.     Date: 01/05/24 Time 1030  Initial assessment completed on  Frank Harris via CHART SCREEN  to determine transition of care needs. Pt currently hospitalized for Diverticulitis of colon with perforation [K57.20].  Patient lives with: Spouse/Significant Other Type of Home: House   Current services prior to admission: None  Current DME:  None. Type of Home Care services:  None     Pt fills medications at   [id number] - [place], [place] - [address] - P [phone] - F [phone]  [address]  [place] [address]  Phone: [phone] Fax: [phone]      Likely discharge plan: Home and No CM Needs  Vs Home w/HH  Case Manager will follow to finalize plans and work to transition patient to the next level of care.       Completed by: JAYSON Persons MSN RN  Case Management Department       01/05/24 1022   Service Assessment   History Provided By Medical Record   Support Systems Spouse/Significant Other   Discharge Planning   Living Arrangements Spouse/Significant Other   Current Services Prior To Admission None   Potential Assistance Needed N/A   Type of Home Care Services None   Condition of Participation: Discharge Planning   The Plan for Transition of Care is related to the following treatment goals: Home vs Home w/HH

## 2024-01-08 NOTE — Progress Notes (Signed)
 [facility] Hospitalist Service     Hospitalist Progress Note     PCP: Patton Lauraine HERO, MD   Admission Date: 01/05/2024        Subjective   Overnight activity reviewed with nursing staff.  Patient is  good today. He is sitting up in his chair. He reports hist stomach feels okay and denies any complaints.    Objective   Blood pressure (!) 143/91, pulse 57, temperature 98.8 F (37.1 C), temperature source Oral, resp. rate 16, height 1.651 m (5' 5), weight 117 kg (258 lb), SpO2 97%.   Temp (24hrs), Avg:98.4 F (36.9 C), Min:98.1 F (36.7 C), Max:98.8 F (37.1 C)    Body mass index is 42.93 kg/m.      Intake/Output Summary (Last 24 hours) at 01/08/2024 1212  Last data filed at 01/08/2024 1102  Gross per 24 hour   Intake 240 ml   Output 1805 ml   Net -1565 ml         Physical Exam:  General: Alert, cooperative, non-toxic appearing, walking  Lungs: Clear to auscultation without wheeze rales or ronchi. Breathing is non-labored  Heart: Normal rate, regular rhythm, no murmur, gallop or edema  Musculoskeletal: Warm, well perfused, no joint swelling or tenderness, no pitting edema  Abdomen: Soft, midline incision dressing in place, bulb with serosanguineous drainage, tender to palpation around surgical site, ostomy with dark liquid stool  Neuro: No tremor  Mental Status: Alert & oriented x 3     Labs and Imaging     Data: I have personally reviewed all lab results and independently reviewed imaging studies performed in the past 24 hours.    Hematologic/Coags Chemistries   Recent Labs     01/06/24  0414 01/07/24  0546 01/08/24  0444   WBC 10.4 8.7 6.4   HGB 12.2* 11.4* 11.8*   HCT 39.6 36.6* 37.1*   PLT 201 222 251     Lab Results   Component Value Date/Time    ALBUMIN 3.0 01/08/2024 04:44 AM     No components found for: HGBA1C  Lab Results   Component Value Date/Time    INR 1.1 01/05/2024 06:24 AM    PROTIME 14.5 01/05/2024 06:24 AM     No results found for: APTT  No results found for: DDIMER   Recent Labs      01/06/24  0414 01/07/24  0546 01/08/24  0444   NA 137 136 137   K 4.5 4.5 4.0   CL 104 105 106   CO2 24 22 21*   BUN 21* 32* 26*   CREATININE 1.4* 1.4* 1.2   ALBUMIN 3.3* 2.9* 3.0*   BILITOT 0.65 0.46 0.34   ALKPHOS 65 67 59   AST 36 37 28   ALT 40 36 36     No results for input(s): GLU in the last 72 hours.  No results found for: CPK, CKMB, TROPONINI  Lab Results   Component Value Date/Time    IRON 91 01/25/2019 09:48 AM    FERRITIN 215.8 01/25/2019 09:48 AM        Inflammatory/Respiratory Diabetes   No results found for: CRP  No results found for: ESR  ABGs:  No results found for: PHART, PO2ART, HCO3, PCO2ART   Lab Results   Component Value Date/Time    LDL 156.2 06/17/2022 12:30 PM    CREATININE 1.2 01/08/2024 04:44 AM              Studies:  No orders  to display       No results found for this or any previous visit.      Medications      loperamide   2 mg Oral TID    insulin  lispro  0-4 Units SubCUTAneous 4 times per day    piperacillin -tazobactam  4,500 mg IntraVENous Q8H    sodium chloride  flush  5-40 mL IntraVENous 2 times per day    sodium chloride  flush  5-40 mL IntraVENous 2 times per day    acetaminophen   650 mg Oral 4 times per day    gabapentin   200 mg Oral TID    methocarbamol   1,000 mg Oral 4x Daily       sodium chloride       dextrose       sodium chloride         (Note: the above list excludes PRNs)    Assessment & Plan     Hospital Problems           Last Modified POA    * (Principal) Diverticulitis of colon with perforation 01/05/2024 Yes    Intestinal obstruction (HCC) 01/06/2024 Yes        #Acute perforated diverticulitis:  #Sepsis:  Patient meets sepsis criteria with heart rate greater than 90 and fever.  WBC 10 with neutrophil predominance.  Lactate within normal limits x 2.  Blood pressure slightly elevated.  CT abdomen pelvis was concerning for perforated sigmoid diverticulitis with surrounding free air and stranding.   Lactate, white blood cell count still remains within normal  limits.  - General surgery following, appreciate assistance   - S/p diagnostic laparotomy converted to explore laparotomy w/ sigmoid resection, colorectal anastomosis, and creation of loop ileostomy    -Doing well from surgical subjective, advanced to regular diet   -Have added banana flakes to help thicken stools  - Continue IV Zosyn    - Continuous normal saline 100 cc/hour  - As needed IV Dilaudid  for pain, as needed IV Zofran  for nausea  --Wound care to assist with ostomy education     #AKI,resolved: Creatinine improved to 1.2, likely prerenal  - IVF as above  - Avoiding nephrotoxic meds    #Hypertension:  - Holding home losartan  in setting of AKI as above     #T2DM:  Only on metformin  at home  - Hold metformin  and place on sliding scale insulin  with every 6 hour glucose checks while n.p.o.     #Morbid obesity with BMI 40-45:  - Complicates all aspects of care and increases surgical risk     #OSA:  - Continue nightly BiPAP     #Daily alcohol use:  Patient drinks about 3 alcoholic beverages daily.  Patient denies any history of alcohol withdrawal.  Does not appear to be withdrawing at present.  -Will watch on CIWA      ADULT ORAL NUTRITION SUPPLEMENT; Breakfast, Lunch, Dinner; Other Oral Supplement; Banana flakes  ADULT ORAL NUTRITION SUPPLEMENT; Breakfast, AM Snack, Lunch, PM Snack, Dinner, HS Snack; Standard High Calorie/High Protein Oral Supplement  ADULT DIET; Regular; Low Fiber   Full Code  DVT ppx:  SCD's or Sequential Compression Device  Dispo: Continue inpatient management for perforated diverticulitis, sepsis,   Aliene ONEIDA Ada, MD  01/08/2024 12:12 PM  [facility] Hospitalist Service    ++++++++++++++++++++++++++++++++++++++++    This note was created using voice recognition software and may contain typographic errors missed during final review. The intent is to have a complete and accurate medical record.   As a  valued partner in this safety effort, if you have noted factual errors, please contact the [facility]  Hospitalist Service at [phone].

## 2024-01-08 NOTE — Plan of Care (Signed)
 Problem: Chronic Conditions and Co-morbidities  Goal: Patient's chronic conditions and co-morbidity symptoms are monitored and maintained or improved  Outcome: Progressing     Problem: Discharge Planning  Goal: Discharge to home or other facility with appropriate resources  Outcome: Progressing     Problem: ABCDS Injury Assessment  Goal: Absence of physical injury  Outcome: Progressing     Problem: Seizure Precautions  Goal: Remains free of injury related to seizures activity  Outcome: Progressing     Problem: Pain  Goal: Verbalizes/displays adequate comfort level or baseline comfort level  Outcome: Progressing     Problem: Respiratory - Adult  Goal: Achieves optimal ventilation and oxygenation  01/08/2024 0944 by Massie Inocente Sevin, RN  Outcome: Progressing  01/08/2024 0356 by Gretel Birchwood, RCP  Outcome: Progressing  Goal: Lung sounds clear or within normal limits for patient  01/08/2024 0356 by Gretel Birchwood, RCP  Outcome: Progressing

## 2024-01-09 ENCOUNTER — Encounter: Payer: PRIVATE HEALTH INSURANCE | Attending: Family Medicine | Primary: Family Medicine

## 2024-01-09 LAB — COMPREHENSIVE METABOLIC PANEL
ALT: 43 U/L — ABNORMAL HIGH (ref 0–42)
AST: 29 U/L (ref 0–46)
Albumin/Globulin Ratio: 1 (ref 1.00–2.70)
Albumin: 3 g/dL — ABNORMAL LOW (ref 3.5–5.2)
Alk Phosphatase: 57 U/L (ref 40–130)
Anion Gap: 9 mmol/L (ref 2–17)
BUN: 18 mg/dL (ref 6–20)
CALCIUM,CORRECTED,CCA: 9.2 mg/dL (ref 8.5–10.7)
CO2: 23 mmol/L (ref 22–29)
Calcium: 8.4 mg/dL — ABNORMAL LOW (ref 8.5–10.7)
Chloride: 105 mmol/L (ref 98–107)
Creatinine: 1.2 mg/dL (ref 0.7–1.3)
Est, Glom Filt Rate: 76 mL/min/1.73mÂ² (ref >=60)
Globulin: 3.1 g/dL (ref 1.9–4.4)
Glucose: 88 mg/dL (ref 70–99)
Osmolaliy Calculated: 275 mosm/kg (ref 270–287)
Potassium: 4.1 mmol/L (ref 3.5–5.3)
Sodium: 137 mmol/L (ref 135–145)
Total Bilirubin: 0.33 mg/dL (ref 0.00–1.20)
Total Protein: 6.1 g/dL (ref 5.7–8.3)

## 2024-01-09 LAB — CBC WITH AUTO DIFFERENTIAL
Basophils %: 0.5 % (ref 0.0–2.0)
Basophils Absolute: 0 x10e3/mcL (ref 0.0–0.2)
Eosinophils %: 3.5 % (ref 0.0–7.0)
Eosinophils Absolute: 0.2 x10e3/mcL (ref 0.0–0.5)
Hematocrit: 38 % (ref 38.0–52.0)
Hemoglobin: 12 g/dL — ABNORMAL LOW (ref 13.0–17.3)
Immature Grans (Abs): 0.03 x10e3/mcL (ref 0.00–0.06)
Immature Granulocytes %: 0.5 % (ref 0.0–0.6)
Lymphocytes Absolute: 2.2 x10e3/mcL (ref 1.0–3.2)
Lymphocytes: 39 % (ref 15.0–45.0)
MCH: 22.8 pg — ABNORMAL LOW (ref 27.0–34.5)
MCHC: 31.6 g/dL (ref 30.0–36.0)
MCV: 72.1 fL — ABNORMAL LOW (ref 84.0–100.0)
MPV: 9.8 fL (ref 7.0–12.2)
Monocytes %: 8.5 % (ref 4.0–12.0)
Monocytes Absolute: 0.5 x10e3/mcL (ref 0.3–1.0)
NRBC Absolute: 0 x10e3/mcL (ref 0.000–0.012)
NRBC Automated: 0 % (ref 0.0–0.2)
Neutrophils %: 48 % (ref 42.0–74.0)
Neutrophils Absolute: 2.7 x10e3/mcL (ref 1.6–7.3)
Platelets: 267 x10e3/mcL (ref 140–440)
RBC: 5.27 x10e6/mcL (ref 4.00–5.60)
RDW: 15.6 % (ref 10.0–17.0)
WBC: 5.6 x10e3/mcL (ref 3.8–10.6)

## 2024-01-09 LAB — POCT GLUCOSE
POC Glucose: 126 mg/dL — ABNORMAL HIGH (ref 65.0–110.0)
POC Glucose: 84 mg/dL (ref 65.0–110.0)

## 2024-01-09 LAB — MAGNESIUM: Magnesium: 1.9 mg/dL (ref 1.6–2.6)

## 2024-01-09 MED ORDER — GABAPENTIN 100 MG PO CAPS
100 | ORAL_CAPSULE | Freq: Three times a day (TID) | ORAL | 3 refills | 30.00000 days | Status: AC
Start: 2024-01-09 — End: 2024-03-09

## 2024-01-09 MED ORDER — OXYCODONE HCL 5 MG PO TABS
5 | Freq: Once | ORAL | Status: AC
Start: 2024-01-09 — End: 2024-01-09
  Administered 2024-01-09: 17:00:00 5 mg via ORAL

## 2024-01-09 MED ORDER — OXYCODONE HCL 5 MG PO TABS
5 | ORAL_TABLET | Freq: Four times a day (QID) | ORAL | 0 refills | 5.00000 days | Status: AC | PRN
Start: 2024-01-09 — End: 2024-01-12

## 2024-01-09 MED ORDER — INSULIN LISPRO 100 UNIT/ML IJ SOLN
100 | Freq: Four times a day (QID) | INTRAMUSCULAR | Status: DC
Start: 2024-01-09 — End: 2024-01-09

## 2024-01-09 MED ORDER — LOPERAMIDE HCL 2 MG PO CAPS
2 | Freq: Two times a day (BID) | ORAL | Status: DC
Start: 2024-01-09 — End: 2024-01-09
  Administered 2024-01-09: 13:00:00 2 mg via ORAL

## 2024-01-09 MED FILL — DILAUDID 1 MG/ML IJ SOLN: 1 mg/mL | INTRAMUSCULAR | Qty: 0.5 | Fill #0

## 2024-01-09 MED FILL — GABAPENTIN 100 MG PO CAPS: 100 mg | ORAL | Qty: 2 | Fill #0

## 2024-01-09 MED FILL — PIPERACILLIN SOD-TAZOBACTAM SO 4.5 (4-0.5) G IV SOLR: 4.5 (4-0.5) g | INTRAVENOUS | Qty: 4500 | Fill #0

## 2024-01-09 MED FILL — NORMAL SALINE FLUSH 0.9 % IV SOLN: 0.9 % | INTRAVENOUS | Qty: 10 | Fill #0

## 2024-01-09 MED FILL — METHOCARBAMOL 500 MG PO TABS: 500 mg | ORAL | Qty: 2 | Fill #0

## 2024-01-09 MED FILL — LOPERAMIDE HCL 2 MG PO CAPS: 2 mg | ORAL | Qty: 1 | Fill #0

## 2024-01-09 MED FILL — ACETAMINOPHEN 325 MG PO TABS: 325 mg | ORAL | Qty: 2 | Fill #0

## 2024-01-09 MED FILL — TYLENOL 325 MG PO TABS: 325 mg | ORAL | Qty: 2 | Fill #0

## 2024-01-09 MED FILL — OXYCODONE HCL 5 MG PO TABS: 5 mg | ORAL | Qty: 1 | Fill #0

## 2024-01-09 NOTE — Discharge Instructions (Signed)
 General Surgery Post Operative Care  [address] office  [phone] Fax : [phone]    Activity/Exercise/Diet:  Avoid heavy lifting 15 pounds and strenuous activity for 4 weeks.   Avoid frequent bending over.   Avoid any activity that could cause your site to reopen.  Routine/casual activity is fine.  Ambulation after surgery is key. It helps loosen your muscles and helps with post-operative pain. We recommend light ambulation as much as possible after surgery.   Lack of ambulation or lying around at home will increase your risk of post-operative complications such as pneumonia, blood clots and wound infections.   Diet as tolerated.    Drink at least 70 ounces of water daily.     Bathing & Incision Care:  May start showering in 24 hours.  Okay to get incisions wet with soap and water while showering, but do not submerge underwater in tub, pool, or spa for 2 weeks.   Most skin wounds heal without problems. However, an infection sometimes occurs despite proper treatment. Therefore, watch for the signs of infection listed below.     Medications:  Pain after an operation (post-op pain) is common and expected. The goal is to manage your pain, not to completely take away your post-op pain. These guidelines can help you stay as comfortable as possible.     Imodium :  - Recommend taking imodium  to help control your ileostomy output. I recommend measuring your output every time you dump your ostomy bag.  - You should have a daily output of - .  - If your ostomy puts out over start taking Imodium  once a day.   - If your ostomy puts out over start taking Imodium  twice a day.  - If it continues to increase output you can add Imodium  up to 4 times a day to control this  - Please make sure you are drinking enough water to keep yourself hydrated. I recommend >70oz a day  - Call the office if you have any questions or issues.       Taking pain medicines  Take only the medicines that your healthcare  provider tells you to take.   Don't take more than prescribed.  Take pain medicines with some food to prevent an upset stomach.   Don't drink alcohol while using pain medicines.   Don't drive while taking opioid pain medicines.    Opioid pain medicines are meant to be used in addition, and as a last resort, to non-narcotic pain medicine.    Non-opioid  All non-opioid medications relieve mild to moderate pain and help to reduce swelling caused by surgery.   We recommend talking a combination of BOTH acetaminophen  and ibuprofen for 4 to 5 days after surgery if you don't have any contraindications to these medications.   We recommend taking 1000mg  acetaminophen  (Two 500mg  extra strength tablets) every 8 hours and Ibuprofen every 8 hours for 4 to 5 days after surgery.  These medications should not be taken together but instead, a few hours apart. That way you can have something for pain every 2-3 hours.   Only if your pain is severe and acetaminophen  and ibuprofen are not helping should you take the prescribed opioid pain medication. If so, the opioid pain medication can be taken either with or in between your OTC medications. Remember to follow the instructions on the prescribed pain medication bottle and do not take them too often.   Possible side effects include stomach upset and bleeding.  High doses may cause kidney or liver problems.      Opioid  Opioids are only available by prescription.   Opioids ease moderate to severe pain.   Possible side effects include stomach upset, nausea, and itching.   Opioids, along with surgery in general, cause constipation. It is recommended to take some sort of a laxative or stool softener for as long as you are taking post-op pain medication  We recommend taking MiraLAX  (polyethylene glycol). This can be bought over the counter. It is to be mixed in liquid and taken at least once per day. Please follow the directions on the bottle. This medication will help with constipation from  surgery and pain medication without causing worse or crampy abdominal pain.       Call the Office (or seek medical attention right away if you notice any of these symptoms):  Severe and persistent Nausea, vomiting, diarrhea, constipation, or stomach cramps   Breathing problems or a fast heart rate   Feeling tired, sluggish, or dizzy   Skin rash  Pain that is not eased with the pain medicine  Temperature > 101.5 F  Increasing pain in the wound  Increasing redness or swelling  Pus coming from the wound  Wound bleeds more than a small amount or bleeding that doesn't stop  Wound edges come apart   If you have any questions or problems, do not hesitate to call the office.    Follow up  Please call the office to schedule your appointment for surgical follow up with Dr. Sheffield  It is recommended that you schedule a follow-up with your Primary Care Provider after hospitalization for continued management of your medical problems.

## 2024-01-09 NOTE — Progress Notes (Signed)
 General Surgery Progress Note    Subjective:  POD 4 s/p diagnostic laparoscopy converted to exploratory laparotomy with sigmoid resection, colorectal anastomosis, creation of loop ileostomy.    Patient was seen and examined at bedside this morning.  He continues to improve on a daily basis, states his abdominal pain is still present, however better than yesterday.  Tolerating a regular diet without any nausea or vomiting.  Had a decrease in his ostomy function to 600 cc out yesterday.  Has been out of bed and ambulating.  Afebrile, nontachycardic.    Objective:    Vital Signs:  Blood pressure (!) 165/118, pulse 57, temperature 98.1 F (36.7 C), temperature source Oral, resp. rate 18, height 1.651 m (5' 5), weight 117 kg (258 lb), SpO2 99%.    Physical Exam:   General: Awake, Well-Nourished, Non-toxic.  Neuro: A&Ox4. Grossly intact.  Cardio: HRR  Resp: Non-labored, Even.  GI: Softly distended.  Appropriately TTP around incision sites.  Nonperitoneal.  Stoma is pink and patent, brown liquid stool noted in ostomy bag.  Peripheral: No edema.  Skin: Midline with staples that are c/d/I. Covered with a dressing for comfort.  Dry and intact.  Psych: Cooperative, Appropriate Mood and affect.  Drains: JP drain in place.  175 cc of output documented yesterday in 24 hrs. Serosang in nature    I/O last 3 completed shifts:  In: 480 [P.O.:480]  Out: 2675 [Urine:1725; Drains:350; Stool:600]  I/O this shift:  In: -   Out: 410 [Urine:300; Drains:35; Stool:75]    Labs:  CBC:  Recent Labs     01/07/24  0546 01/08/24  0444 01/09/24  0501   WBC 8.7 6.4 5.6   RBC 5.00 5.11 5.27   HGB 11.4* 11.8* 12.0*   HCT 36.6* 37.1* 38.0   MCV 73.2* 72.6* 72.1*   RDW 16.3 15.9 15.6   PLT 222 251 267     CHEMISTRIES:  Recent Labs     01/07/24  0546 01/08/24  0444 01/09/24  0501   NA 136 137 137   K 4.5 4.0 4.1   CL 105 106 105   CO2 22 21* 23   BUN 32* 26* 18   CREATININE 1.4* 1.2 1.2   GLUCOSE 92 81 88   MG 2.9* 2.5 1.9     LIVER PROFILE:  Recent  Labs     01/07/24  0546 01/08/24  0444 01/09/24  0501   AST 37 28 29   ALT 36 36 43*   BILITOT 0.46 0.34 0.33   ALKPHOS 67 59 57     COAG:  PT/INR:No results for input(s): PROTIME, INR in the last 72 hours.  APTT:No results for input(s): APTT in the last 72 hours.    Imaging/Diagnostics   CT ABDOMEN PELVIS W IV CONTRAST Additional Contrast? None  Result Date: 01/05/2024  1. Perforated sigmoid diverticulitis with focal contained free air adjacent sigmoid mesocolon. No drainable abscess currently. 2. Hepatic steatosis. Small hiatal hernia. Initial report by [facility].      ASSESSMENT AND PLAN:  43 year old male with a past medical history of obesity, T2DM, HTN who presented to the emergency department on 7/25 with complaints of abdominal pain and nausea. Workup in the emergency department included a CTAP which demonstrated contained free air without abscess. He was admitted to the medical service and general surgery was consulted for further evaluation.  He was taken to the operating room on 7/25 for intervention.     POD4 s/p diagnostic laparoscopy  converted to exploratory laparotomy with sigmoid resection, colorectal anastomosis, and creation of loop ileostomy     Perforated diverticulitis  - Chart / labs reviewed.  WBC is 5.6K.  Hemoglobin is 12.0.  Magnesium  normal at 1.9.  Creatinine is 1.2 this morning.  - Continue regular diet  - Ileostomy put out 600 cc of liquid yesterday in 24h.  Will bump Imodium  back to twice daily.  I educated patient how to titrate Imodium  as needed when he goes home.  - As needed electrolyte replacement per primary  - Continue IV antibiotics: Zosyn .  Once he finishes up his antibiotics today, he will not require any further p.o. abx from a surgical perspective.  - Will have the WOCN team stop in and see him today before he leaves for another ostomy education session.  - Consulted dietician to talk with patient about diet and education.  - Continue JP drain to bulb suction.   Monitor output.  We will leave this in place on discharge and have him see Dr. Sheffield in clinic to remove along with his staples.  - Encourage OOB and ambulation  - Multimodal analgesia and as needed antiemetics.   - Encourage incentive spirometer use     2.  Obesity  - Impacts all aspects of medical care and increases risk of post-operative complications.      3. AKI  - Resolved. Cr was 1.2 this AM. Likely was pre-renal in nature   - DC IVF   - Avoid nephrotoxic drugs  - Appreciate hospitalist assistance in management     DVT ppx: SCDs, okay to start chemical ppx from a surgical perspective     Hospitalist managing:  -T2DM  - Microcytic anemia  - OSA   - HTN  - Sarcoidosis    Dispo: Patient cleared for discharge from with general surgery perspective.  We will have him follow-up in clinic on Friday, 8/1, to see Dr. Sheffield.  Please reach out with any questions or concerns regarding the care of this patient.     Tinnie Pilon, PA-C  General Surgery

## 2024-01-09 NOTE — Care Coordination-Inpatient (Signed)
 Date: 01/09/24   Time 1400        Frank Harris is medically stable and cleared for discharge today and has been cleared by the Interdisciplinary team. CM spoke with patient on 01/08/24 to confirm discharge plan.  Patient will be discharging to Home with Home Health.  Patient has chosen [facility]     and is in agreement with the discharge plan. Orders received and have been sent to and verified with Joelle w/[facility].  No further CM needs identified at this time.     Completed by:   JAYSON Persons MSN RN   Case Management Department       01/09/24 1400   Services At/After Discharge   Transition of Care Consult (CM Consult) Home Health   Internal Home Health Yes   Services At/After Discharge Nursing services   Condition of Participation: Discharge Planning   The Plan for Transition of Care is related to the following treatment goals: Home with [facility]   The Patient and/or Patient Representative was provided with a Choice of Provider? Patient

## 2024-01-09 NOTE — Wound Image (Signed)
 Reassessment of new ileostomy management.    Presents sitting up in bed, awake and alert.       01/09/24 1200   Ileostomy/Jejunostomy RLQ Loop ileostomy   Placement Date: 01/05/24   Location: RLQ  Ostomy Type: Loop ileostomy   Stomal Appliance 2 piece   Stoma  Assessment Red   Peristomal Assessment Intact   Mucocutaneous Junction Intact   Treatment Pouch change   Stool Appearance Soft   Stool Color Brown     WOC visit for stoma assessment and pouch care/teaching.    Incision Intact with staples . Mepilex surgical cover dressing applied to cover staples    Stoma is Beefy red, 35mm.    Support rod removed under loop stoma without incident.  Convatec moldable wafer with drainable pouch, flat eakin ring applied as gasket around stoma.  Ostomy belt placed to help keep pouch secure.  Extra supply at bedside for discharge.    WOC education provided:   - Proper pouch removal to prevent skin stripping.  - Measuring stoma and cutting pouch size accordingly.  - Diet Teaching (stool obstructing, gas producing, stool thickening, stool loosening, and odor producing foods)  -No heavy lifting (to prevent herniation)  -Change pouch every 3-4 days (and PRN for leakage)  - May shower with or without pouch on (only cleanse with soap if showering)  - Do not utilize baby wipes or lotions around stoma as this will disrupt pouch adherence.  - Empty pouch when 1/3-1/2 full.   - Signs of dehydration and how to monitor (monitor urine color - lighter means more hydrated, too dark-drink more fluid; avoid dehydration by drinking 8-10 glasses of water with electrolytes daily and eating stool thickening foods)     WOC to follow up to continue to provide education prior to DC

## 2024-01-09 NOTE — Discharge Summary (Signed)
 [facility] Hospitalist Service    Discharge Summary     Patient ID:  Frank Harris [id number] y.o. 1980/07/27    Admit date: 01/05/2024  Discharge date and time: No discharge date for patient encounter.   Admitting Physician: Glendia ONEIDA Kalata, MD   Discharge Physician: Belvie KATHEE Daring, MD     Discharge Diagnoses   Principal Problem:    Diverticulitis of colon with perforation  Active Problems:    Intestinal obstruction [facility])  Resolved Problems:    * No resolved hospital problems. [facility] Course     Frank Harris is a 43 year old male with past medical history of T2DM (not on long-term insulin ), HTN, OSA on nightly BiPAP, and morbid obesity with BMI of 43 who presented to the [facility] ED overnight with abdominal pain.     Patient endorsed lower abdominal pain for 3 days which is worsened and has become diffuse throughout the entire abdomen over the past 24 hours with nausea but no vomiting.     On the ED he was febrile at 101.7, heart rate 80s-90s, BP 150/90, RR 16, and SpO2 98% on room air.  Labs are notable for WBC of 10.0k with 85% neutrophils, lactate within normal limits x 2, and noninfectious appearing urinalysis.  CT abdomen pelvis was concerning for perforated sigmoid diverticulitis with surrounding free air and stranding.  The ED physician discussed case with general surgery, Dr. Sheffield.  She wanted to try conservative management first with IV antibiotics given that patient was hemodynamically stable and has elevated surgical risk due to his comorbidities. She requested that patient be placed on the hospitalist service and general surgery will follow in consultation.  Patient was given multiple rounds of IV narcotic pain medications, 1 L of normal saline, and 4.5 g of IV Zosyn  in the ED.  Due to lack of available beds at [facility] he was transferred to [facility] for further care.     On arrival to [facility] this morning he is febrile with temperature of 102.29F, mildly  tachycardic with heart rate of 99, BP 161/94 and arrived on 3 L nasal cannula as he generally wears BiPAP at night.  Patient reports that around 3 days ago he started having crampy lower abdominal pain.  Yesterday while at work his abdominal pain increased significantly and became more diffuse.  The pain worsens with any kind of movement.  He also started having fevers yesterday morning.  He denies any recent diarrhea, melena, or hematochezia.  He actually states that he was constipated in recent days and last night he took some magnesium  citrate which caused him to have a large loose bowel movement.  He endorses increased fatigue and generalized weakness.  He denies any prior abdominal surgeries.  He denies any shortness of breath at rest, chest discomfort, lower extremity edema, or urinary symptoms.  He does drink alcohol daily, about 3 beverages daily.  He denies any history of alcohol withdrawal. - HPI     #Acute perforated diverticulitis:  #Sepsis:  Patient meets sepsis criteria with heart rate greater than 90 and fever.  WBC 10 with neutrophil predominance.  Lactate within normal limits x 2.  Blood pressure slightly elevated.  CT abdomen pelvis was concerning for perforated sigmoid diverticulitis with surrounding free air and stranding.   Lactate, white blood cell count still remains within normal limits.  - General surgery following, appreciate assistance                -  S/p diagnostic laparotomy converted to explore laparotomy w/ sigmoid resection, colorectal anastomosis, and creation of loop ileostomy                 -Doing well from surgical subjective, advanced to regular diet                -Have added banana flakes to help thicken stools  - Continue IV Zosyn , okay to discontinue per surgery  - Continuous normal saline 100 cc/hour  - As needed IV Dilaudid  for pain, as needed IV Zofran  for nausea  --Wound care to assist with ostomy education   -7/29 discussed with surgery patient tolerating diet well  can discharge to home today     #AKI,resolved: Creatinine improved to 1.2, likely prerenal  - Avoiding nephrotoxic meds     #Hypertension:  - Holding home losartan  in setting of AKI as above     #T2DM:  Only on metformin  at home     #Morbid obesity with BMI 40-45:  - Complicates all aspects of care and increases surgical risk     #OSA:  - Continue nightly BiPAP     #Daily alcohol use:  Patient drinks about 3 alcoholic beverages daily.  Patient denies any history of alcohol withdrawal.  Does not appear to be withdrawing at present.            Consults: IP CONSULT TO GENERAL SURGERY  IP CONSULT TO DIETITIAN    Surgeries/Procedures Performed:  Procedure(s):  LAPAROTOMY EXPLORATORY, SIGMOIDECTOMY WITH PRIMARY ANASTAMOSIS, CREATION OF DIVERTING LOOP ILEOSTOMY  LAPAROSCOPY DIAGNOSTIC       Radiology Last 7 Days:  CT ABDOMEN PELVIS W IV CONTRAST Additional Contrast? None  Result Date: 01/05/2024  1. Perforated sigmoid diverticulitis with focal contained free air adjacent sigmoid mesocolon. No drainable abscess currently. 2. Hepatic steatosis. Small hiatal hernia. Initial report by [facility].      Discharge Exam:  Blood pressure (!) 165/118, pulse 57, temperature 98.1 F (36.7 C), temperature source Oral, resp. rate 18, height 1.651 m (5' 5), weight 117 kg (258 lb), SpO2 99%.   Body mass index is 42.93 kg/m.    Gen: Conversive, Pleasant, Awake, Alert  HEENT: Sclera clear, mucous membranes moist.  Pulmonary: No distress, CTA B/L.  Normal respiratory effort.  Cardiac: Regular rate and rhythm, no murmur, no rub  Abdomen: Non-tender in all quadrants, no guarding, normal bowel sounds, non-distended  Extremities: no deformity, no significant pitting edema  Neuro: Alert and oriented x 3, speech normal  Skin: clean dry intact  Psych: Normal affect, mood    Discharge Plan   Disposition: Home with Home Health Services    Provider Follow-Up:   [facility] [place]  [address]  [address]  [place]  [place]  [address]  [phone]        Sheffield Donnajean HERO, MD  [address]  [place] [address]  [phone]    Follow up on 01/12/2024  drain removal, post-operative follow-up       In process/preliminary results:  Outstanding Order Results       Date and Time Order Name Status Description    01/05/2024  6:24 AM ANTIBODY SCREEN In process     01/05/2024  6:24 AM ABO/RH In process     01/05/2024  6:24 AM TYPE AND SCREEN In process     01/05/2024  5:58 AM ANTIBODY SCREEN In process     01/05/2024  5:58 AM ABO/RH In process  01/05/2024  5:58 AM TYPE AND SCREEN In process     01/05/2024  3:12 AM Culture, Blood 2 Preliminary     01/05/2024  3:12 AM Culture, Blood 1 Preliminary             Patient Instructions   Diet: regular diet  Activity: activity as tolerated    Discharge Medications         Medication List        START taking these medications      gabapentin  100 MG capsule  Commonly known as: NEURONTIN   Take 2 capsules by mouth 3 times daily for 60 days.            CHANGE how you take these medications      albuterol  sulfate HFA 108 (90 Base) MCG/ACT inhaler  Commonly known as: PROVENTIL ;VENTOLIN ;PROAIR   2-4 puff as needed Inhalation every 4 hrs for 30 days  What changed:   how much to take  how to take this  when to take this  reasons to take this  additional instructions     amLODIPine  10 MG tablet  Commonly known as: NORVASC   TAKE 1 TABLET BY MOUTH EVERY DAY  What changed:   how much to take  how to take this  when to take this  additional instructions            CONTINUE taking these medications      acetaminophen  650 MG extended release tablet  Commonly known as: TYLENOL      ALLEGRA-D 12 HOUR PO     fluticasone  50 MCG/ACT nasal spray  Commonly known as: FLONASE   2 sprays by Each Nostril route daily     metFORMIN  500 MG extended release tablet  Commonly known as: GLUCOPHAGE -XR  Take 1 tablet by mouth daily (with breakfast)            STOP taking these medications      ibuprofen 800 MG  tablet  Commonly known as: ADVIL;MOTRIN     Linzess 290 MCG Caps capsule  Generic drug: linaclotide     losartan  100 MG tablet  Commonly known as: COZAAR      methocarbamol  500 MG tablet  Commonly known as: ROBAXIN             ASK your doctor about these medications      lidocaine  5 %  Commonly known as: LIDODERM                Where to Get Your Medications        These medications were sent to [id number] - [place], [place] - [address] [place] [phone] - F [phone]  [address] [place] [address]      Phone: [phone]   gabapentin  100 MG capsule         Discharge Medications  (summarized)     Current Outpatient Medications   Medication Instructions   . acetaminophen  (TYLENOL ) 650 MG extended release tablet 1 tablet, Oral, EVERY 8 HOURS PRN   . albuterol  sulfate HFA (PROVENTIL ;VENTOLIN ;PROAIR ) 108 (90 Base) MCG/ACT inhaler 2-4 puff as needed Inhalation every 4 hrs for 30 days   . amLODIPine  (NORVASC ) 10 MG tablet TAKE 1 TABLET BY MOUTH EVERY DAY   . Fexofenadine-Pseudoephedrine (ALLEGRA-D 12 HOUR PO) 1 tablet, Oral, 2 TIMES DAILY PRN   . fluticasone  (FLONASE ) 50 MCG/ACT nasal spray 2 sprays, Each Nostril, DAILY   . gabapentin  (NEURONTIN ) 200 mg, Oral, 3 TIMES DAILY   . lidocaine  (LIDODERM ) 5 %  1 patch, DAILY   . metFORMIN  (GLUCOPHAGE -XR) 500 mg, Oral, DAILY WITH BREAKFAST       Time Spent on Discharge:  67 minutes were spent on preparing this discharge.  Greater than 50% of this time was spent on direct patient care, or coordination of that care.         Electronically signed by Belvie KATHEE Daring, MD on 01/09/24 at 11:45 AM EDT      Certification and medical necessity: I certify via this face-to-face encounter performed on 01/09/2024. Based on my findings, Frank Harris is homebound and has a taxing effort to leave the home due to illness/injury. [place] JINNY Hopkins requires the follow services: - Medication compliance and education for  new medications changes in previous  medications safety concerns wound care for ostomy

## 2024-01-09 NOTE — Progress Notes (Signed)
 Comprehensive Nutrition Assessment    Type and Reason for Visit:  Initial, Patient education (-ileostomy)    Nutrition Recommendations/Plan:   Continue PO diet -under MD adjusted to 4 carb restriction for better caloric/protein provision   Supplement commercial beverage: adjusted from Ensure Plus 6x/day to Ensure High Protein TID-more appropriate with ileostomy and patient eating (480 kcal, 48 g PRO)   -lower simple sugar   3. Diet education: Provided ileostomy diet education. Discussed altered anatomy, foods to start with (soft-cooked starches/vegetables), foods to avoid (carbonated beverages, fibrous foods), the importance of protein intake, the importance of chewing food well, starting with smaller/more frequent meals, proper hydration, and using a chewable or liquid MVI. Written education materials provided with contact info. Good reception-good compliance anticipated.    4. At least weekly weights for trending      Malnutrition Assessment:  Malnutrition Status:  No malnutrition (01/09/24 1225)       Nutrition Assessment:    78 yoM presented with ABD pain. Admitted with acute diverticulitis with perforation. Surgery c/s. S/P diagnostic lap, ex lap, sigmoid colectomy with colorectal anastomosis, and creation of diverting ileostomy (7/25). No post op c/s received. PO diet in place. Goal for d/c today. Full code.     Intake: Tolerating PO-ate >50% of breakfast today. Is aware of need of ONS.     Nutrition H/x: PTA regular diet. Had no issues with appetite/intake. No reported chewing/swallowing issues. Shellfish allergy.     Social H/x: Lives at home. Not screened positive for food insecurity.     A1c 6.8 (7/25) -controlled  Lab Results   Component Value Date/Time    NA 137 01/09/2024 05:01 AM    K 4.1 01/09/2024 05:01 AM    CL 105 01/09/2024 05:01 AM    CO2 23 01/09/2024 05:01 AM    BUN 18 01/09/2024 05:01 AM    CREATININE 1.2 01/09/2024 05:01 AM    GLUCOSE 88 01/09/2024 05:01 AM    CALCIUM 8.4 01/09/2024 05:01 AM     MG 1.9 01/09/2024 05:01 AM     Lab Results   Component Value Date/Time    TRIG 149 06/17/2022 12:30 PM    TRIG 176 06/20/2019 02:10 PM    TRIG 145 12/19/2018 10:00 AM     Lab Results   Component Value Date/Time    POCGLU 84.0 01/09/2024 05:43 AM    POCGLU 126.0 01/08/2024 11:16 PM    POCGLU 75.0 01/08/2024 05:25 PM    POCGLU 128.0 01/08/2024 12:41 PM    POCGLU 72.0 01/08/2024 08:38 AM    POCGLU 74.0 01/07/2024 04:55 PM     Scheduled Meds:   insulin  lispro  0-4 Units SubCUTAneous 4x Daily AC & HS    loperamide   2 mg Oral BID    piperacillin -tazobactam  4,500 mg IntraVENous Q8H    sodium chloride  flush  5-40 mL IntraVENous 2 times per day    sodium chloride  flush  5-40 mL IntraVENous 2 times per day    acetaminophen   650 mg Oral 4 times per day    gabapentin   200 mg Oral TID    methocarbamol   1,000 mg Oral 4x Daily     Continuous Infusions:   sodium chloride       dextrose       sodium chloride        PRN Meds:.sodium chloride  flush, sodium chloride , magnesium  sulfate, ondansetron  **OR** ondansetron , bisacodyl , aluminum & magnesium  hydroxide-simethicone, glucose, dextrose  bolus **OR** dextrose  bolus, glucagon  (rDNA), dextrose , sodium chloride  flush, sodium chloride ,  acetaminophen , HYDROmorphone , HYDROmorphone , phenol    Edema: none recorded   BM: 600 mL stool output recorded yesterday     Past Medical History:   Diagnosis Date    Diabetes mellitus, type 2 (HCC)     Hypertension, essential     Microcytic anemia     w/ elevated red blood cell mass    OSA treated with BiPAP     Sarcoidosis          Nutrition Related Findings:    adequate muscle and subcutaneous fat stores noted Wound Type: Surgical Incision       Current Nutrition Intake & Therapies:    Average Meal Intake: 51-75%  Average Supplements Intake: None Ordered  ADULT ORAL NUTRITION SUPPLEMENT; Breakfast, AM Snack, Lunch, PM Snack, Dinner, HS Snack; Standard High Calorie/High Protein Oral Supplement  ADULT DIET; Regular; 3 carb choices (45 gm/meal); Low  Fiber  Current Tube Feeding (TF) Orders:    Feeding Regimen: None at this time.   Current Parenteral Nutrition Orders:     Rate/Volume: None at this time.     Anthropometric Measures:  Height: 165.1 cm (5' 5)  Ideal Body Weight (IBW): 136 lbs (62 kg)       Current Body Weight: 117 kg (257 lb 15 oz), 189.7 % IBW. Weight Source: Bed scale  Current BMI (kg/m2): 42.9           Weight Adjustment For: No Adjustment        Weight History Weight - Scale Weight - Scale Weight Method   01/05/2024 258 lbs  258 lbs 117 kg  117 kg Stated   12/07/2023 238 lbs 108 kg -   10/06/2022 241 lbs 109.3 kg -   09/23/2022 239 lbs 108.4 kg -      BMI Categories: Obese Class 3 (BMI 40.0 or greater)    Estimated Daily Nutrient Needs:  Energy Requirements Based On: Kcal/kg  Weight Used for Energy Requirements: Ideal  Energy (kcal/day): 1860-2170 kcal (30-35 kcal/kg IBW)  Estimated Carb Requirement: 186-217 g carbs (40% EER)   Weight Used for Protein Requirements: Ideal  Protein (g/day): 124 g PRO (2 g/kg IBW)  Method Used for Fluid Requirements: Other  Fluid (ml/day): 1 L + output or per MD    Nutrition Diagnosis:   Food & nutrition-related knowledge deficit related to Altered GI structure, altered GI function as evidenced by s/p surgery    Nutrition Interventions:   Food and/or Nutrient Delivery: Modify Current Diet, Modify Oral Nutrition Supplement  Nutrition Education/Counseling: Education/Counseling completed  Coordination of Nutrition Care: No recommendation at this time       Goals:  Goals: Meet at least 75% of estimated needs, by next RD assessment, Teach back diet education  Type of Goal: New goal       Nutrition Monitoring and Evaluation:   Behavioral-Environmental Outcomes: Knowledge or Skill  Food/Nutrient Intake Outcomes: Food and Nutrient Intake, Supplement Intake  Physical Signs/Symptoms Outcomes: Biochemical Data, Skin, Weight    Discharge Planning:    Continue current diet, Continue Oral Nutrition Supplement     Rollene Seeds,  RD  Contact: Thank you for allowing me to participate in the care of this patient.   Please contact your Registered Dietitian with any questions or concerns.     [facility]: [phone] [place]: [phone]  [facility]: [phone]  [facility]: [phone]  Or message your clinical nutrition team via [facility]

## 2024-01-10 LAB — CULTURE, BLOOD 2

## 2024-01-10 NOTE — Telephone Encounter (Signed)
 Care Transitions Initial Follow Up Call    Outreach made within 2 business days of discharge: Yes    Patient: Frank Harris Patient DOB: 18-Feb-1981   MRN: [id number]  Reason for Admission: Diverticulitis of colon with perforation   Discharge Date: 01/09/24       Spoke with: Patient    Discharge department/facility: [facility]    TCM Interactive Patient Contact:  Was patient able to fill all prescriptions: Yes  Was patient instructed to bring all medications to the follow-up visit: Yes  Is patient taking all medications as directed in the discharge summary? Yes  Does patient understand their discharge instructions: Yes  Does patient have questions or concerns that need addressed prior to 7-14 day follow up office visit: no    Additional needs identified to be addressed with provider  No needs identified             Scheduled appointment with PCP within 7-14 days yes     Follow Up  No future appointments.    Dakota Vanwart, MA

## 2024-01-11 NOTE — Telephone Encounter (Signed)
 Called patient to schedule him an appointment. I discussed with him that we can do a return to work letter when he comes in for his appointment on 01/12/2024.

## 2024-01-12 ENCOUNTER — Ambulatory Visit
Admit: 2024-01-12 | Discharge: 2024-01-12 | Payer: PRIVATE HEALTH INSURANCE | Attending: Student in an Organized Health Care Education/Training Program | Primary: Family Medicine

## 2024-01-12 ENCOUNTER — Encounter

## 2024-01-12 DIAGNOSIS — Z9049 Acquired absence of other specified parts of digestive tract: Principal | ICD-10-CM

## 2024-01-12 MED ORDER — GOLYTELY 236 G PO SOLR
236 | Freq: Once | ORAL | 0 refills | 1.00000 days | Status: AC
Start: 2024-01-12 — End: 2024-01-12

## 2024-01-12 NOTE — Telephone Encounter (Signed)
 Frank Harris (DOB:  01/28/1981) is a 43 y.o. male    Has been scheduled for Colonoscopy on 03/18/2024    Arrival Time 7:00am Procedure Time 9:00am    PLACE OF SERVICE LOCATION: [facility]    ANESTHESIA : MAC Sedation    PREPARATION: GoLytely   1 day    SPECIAL INSTRUCTIONS: NONE    LENGTH OF Procedure: 30 Min    Prep Sent? Yes via mychart. Thank you

## 2024-01-12 NOTE — Progress Notes (Signed)
 Surgery Clinic    Chief Complaint   Patient presents with    Post-Op Check     Patient is S/P Sigmoidectomy with ileostomy creation on  01/05/2024.      Frank Harris comes in today with his wife for his first postop appointment after sigmoidectomy with diverting loop ileostomy for perforated sigmoid diverticulitis.  He looks great.  He is titrating his banana flakes per ileostomy output.  We took out his drain today.  On exam, his incision looks great and ileostomy looks great.  We went over his pathology.  He will need a colonoscopy in 6 to 8 weeks and I already reached out to GI, Dr. Dolph.  After colonoscopy and barium enema, we will plan on takedown of his ileostomy.  I will see him next week for staple removal.    PCP: Patton Lauraine HERO, MD    Donnajean Griffes, MD  [facility]

## 2024-01-12 NOTE — Telephone Encounter (Signed)
 Please review and send. Thank you

## 2024-01-15 ENCOUNTER — Ambulatory Visit
Admit: 2024-01-15 | Discharge: 2024-01-15 | Payer: PRIVATE HEALTH INSURANCE | Attending: Family Medicine | Primary: Family Medicine

## 2024-01-15 VITALS — BP 142/80 | HR 66 | Ht 65.0 in | Wt 219.8 lb

## 2024-01-15 DIAGNOSIS — K5792 Diverticulitis of intestine, part unspecified, without perforation or abscess without bleeding: Principal | ICD-10-CM

## 2024-01-15 LAB — LIPID PANEL
Chol/HDL Ratio: 4.1 (ref 0.0–4.4)
Cholesterol, Total: 116 mg/dL (ref 100–200)
HDL: 28 mg/dL — ABNORMAL LOW (ref >=40)
LDL Cholesterol: 56 mg/dL (ref 0.0–100.0)
LDL/HDL Ratio: 2
Triglycerides: 160 mg/dL — ABNORMAL HIGH (ref 0–149)
VLDL: 32 mg/dL (ref 5.0–40.0)

## 2024-01-15 LAB — COMPREHENSIVE METABOLIC PANEL
ALT: 51 U/L — ABNORMAL HIGH (ref 0–42)
AST: 28 U/L (ref 0–46)
Albumin/Globulin Ratio: 1.4 (ref 1.00–2.70)
Albumin: 4 g/dL (ref 3.5–5.2)
Alk Phosphatase: 76 U/L (ref 40–130)
Anion Gap: 13 mmol/L (ref 2–17)
BUN: 17 mg/dL (ref 6–20)
CO2: 24 mmol/L (ref 22–29)
Calcium: 9.8 mg/dL (ref 8.5–10.7)
Chloride: 102 mmol/L (ref 98–107)
Creatinine: 1.2 mg/dL (ref 0.7–1.3)
Est, Glom Filt Rate: 76 mL/min/1.73mÂ² (ref >=60)
Globulin: 2.8 g/dL (ref 1.9–4.4)
Glucose: 122 mg/dL — ABNORMAL HIGH (ref 70–99)
Osmolaliy Calculated: 280 mosm/kg (ref 270–287)
Potassium: 4.6 mmol/L (ref 3.5–5.3)
Sodium: 139 mmol/L (ref 135–145)
Total Bilirubin: 0.31 mg/dL (ref 0.00–1.20)
Total Protein: 6.8 g/dL (ref 5.7–8.3)

## 2024-01-15 LAB — CBC WITH AUTO DIFFERENTIAL
Basophils %: 0.9 % (ref 0.0–2.0)
Basophils Absolute: 0.1 x10e3/mcL (ref 0.0–0.2)
Eosinophils %: 3.2 % (ref 0.0–7.0)
Eosinophils Absolute: 0.2 x10e3/mcL (ref 0.0–0.5)
Hematocrit: 43.2 % (ref 38.0–52.0)
Hemoglobin: 13.4 g/dL (ref 13.0–17.3)
Immature Grans (Abs): 0.02 x10e3/mcL (ref 0.00–0.06)
Immature Granulocytes %: 0.4 % (ref 0.0–0.6)
Lymphocytes Absolute: 2.5 x10e3/mcL (ref 1.0–3.2)
Lymphocytes: 43.6 % (ref 15.0–45.0)
MCH: 22.4 pg — ABNORMAL LOW (ref 27.0–34.5)
MCHC: 31 g/dL (ref 30.0–36.0)
MCV: 72.4 fL — ABNORMAL LOW (ref 84.0–100.0)
MPV: 10.4 fL (ref 7.0–12.2)
Monocytes %: 7.6 % (ref 4.0–12.0)
Monocytes Absolute: 0.4 x10e3/mcL (ref 0.3–1.0)
NRBC Absolute: 0 x10e3/mcL (ref 0.000–0.012)
NRBC Automated: 0 % (ref 0.0–0.2)
Neutrophils %: 44.3 % (ref 42.0–74.0)
Neutrophils Absolute: 2.5 x10e3/mcL (ref 1.6–7.3)
Platelets: 400 x10e3/mcL (ref 140–440)
RBC: 5.97 x10e6/mcL — ABNORMAL HIGH (ref 4.00–5.60)
RDW: 15.8 % (ref 10.0–17.0)
WBC: 5.7 x10e3/mcL (ref 3.8–10.6)

## 2024-01-15 NOTE — Progress Notes (Signed)
 Chief Complaint:     Follow-Up from Hospital      Assessment & Plan   ASSESSMENT/PLAN:    ICD-10-CM    1. Diverticulitis  K57.92       2. S/P colostomy (HCC)  Z93.3       3. AKI (acute kidney injury)  N17.9       4. Type 2 diabetes mellitus without complication, without long-term current use of insulin  (HCC)  E11.9       5. Essential (primary) hypertension  I10       6. OSA (obstructive sleep apnea)  G47.33       7. Class 2 obesity due to excess calories without serious comorbidity with body mass index (BMI) of 36.0 to 36.9 in adult  E66.812     E66.09     Z68.36         Reviewed hospital records. Blood pressure is above goal. Will check labs and if kidney function at baseline, discussed restarting losartan . Continue current medications for now. Continue BIPAP.  Keep surgery follow-up as planned.     Return in about 3 months (around 04/16/2024).         Subjective   SUBJECTIVE/OBJECTIVE:      Patient with past medical history of hypertension, diabetes, OSA on BIPAP here for hospital follow-up of sepsis due to perforated diverticulitis. He is s/p sigmoidectomy and loop ileostomy.     Notes he has been doing well since being home.     Notes he has been eating well. Notes good ostomy output.     Has home health in place.              ROS as per HPI or otherwise negative.           Objective   Vitals:    01/15/24 0923   BP: (!) 142/80   Pulse: 66   SpO2: 97%       GENERAL: The patient is in no apparent distress. Alert and oriented. Vital Signs Reviewed HEENT: Head is normocephalic and atraumatic. Extraocular muscles are intact. Pupils are equal, Conjunctiva normal. Moist Mucous membranes. Posterior pharynx clear of any exudate or lesions. NECK: Supple. No Lymphadenopathy LUNGS: Clear to auscultation bilaterally. Breath Sounds equal. HEART: Regular rate and rhythm without murmur. No edema ABDOMEN: Soft, nontender, and nondistended. . Musculoskeletal: No deformity. Gait WNL. No swelling noted. NEUROLOGIC: Alert and  Oriented. No focal deficits. PSYCHIATRIC: Cooperative, mood appropriate. SKIN: Nincision is c/d/I. Staples intact.                An electronic signature was used to authenticate this note.    --Lauraine CHRISTELLA Melody, MD

## 2024-01-16 MED ORDER — LOSARTAN POTASSIUM 100 MG PO TABS
100 | ORAL_TABLET | Freq: Every day | ORAL | 5 refills | 90.00000 days | Status: AC
Start: 2024-01-16 — End: ?

## 2024-01-19 ENCOUNTER — Ambulatory Visit
Admit: 2024-01-19 | Discharge: 2024-01-19 | Payer: PRIVATE HEALTH INSURANCE | Attending: Student in an Organized Health Care Education/Training Program | Primary: Family Medicine

## 2024-01-19 DIAGNOSIS — Z9049 Acquired absence of other specified parts of digestive tract: Principal | ICD-10-CM

## 2024-01-19 NOTE — Progress Notes (Signed)
 Surgery Clinic    Chief Complaint   Patient presents with    Post-Op Check     Patient is S/P open sigmoidectomy and is here for staple removal.      Frank Harris comes in today for follow-up.  He is doing well.  We took out his staples today.  He will need a colonoscopy and barium enema before we take down his diverting loop ileostomy.  Will plan on surgery date for 8 weeks after his index operation.    PCP: Patton Lauraine HERO, MD    Donnajean Griffes, MD  [facility]

## 2024-01-23 ENCOUNTER — Encounter

## 2024-01-23 NOTE — Progress Notes (Signed)
 Pre Procedure Patient Instructions     Procedure Location hospital:[facility]: [address], [place] in the [facility] [address] [place]. Bring your parking ticket with you inside to be validated. Take the covered pathway to the Admitting Entrance.  Follow the signs to Outpatient Registration (down the hall on your left).  Once registered, someone will escort you to the Endoscopy Department.   Procedure Date 02/02/24  Arrival Time 9:00    Your doctor determines your scheduled start time.  Depending on the facility where your procedure is taking place and the scheduled start time, you may be required to arrive up to 2 hours prior.  This is to allow time for registration, your preop assessment, any day of procedure testing and to meet with your care teams members.  This also allows your procedure to be performed earlier should there be a cancellation that day.  We appreciate your patience as we work to provide you with excellent service.    Medications: Follow the medication instructions below to prevent your procedure from being cancelled.    Medication to be taken the morning of surgery with a few sips of water only:   AMLODIPINE   FLONASE   ALBUTEROL  IF NEEDED  GABAPENTIN   ALLEGRA IF NEEDED    Continue to take your medications as prescribed, with the following exceptions:     Hold or reduce the dosage of the following medication, as discussed:     Take METFORMIN  the day/night before as per usual.  DO NOT take the morning of procedure.      Do not take over the counter pain medications except plain Tylenol  or Acetaminophen  for seven days prior to your procedure unless your doctor told you otherwise.    If you are taking any diabetes and or weight loss medications (including injectables and shots) not discussed during the call with the PreAdmission Nurse, it is important to call [phone] to discuss important instructions.    If you are taking blood thinners, and have not been told when to hold  the dose, call the doctor performing your procedure for instructions on when to stop.    On the day of your procedure, a nurse will review your medications and ask when you last took each one.        Procedure Preparation    Diet Restrictions:Follow the prep instructions from your doctor.      If you have questions on the day of your procedure call [facility] at [phone]    Skin Preparation:    Do not put on any deodorants, lotions, powders, or oils on the day of procedure.  Be sure to put on clean, comfortable loose fitting clothing.    Other Preparation:  Call your doctor right away if you have any fever, cold or flu symptoms  Bowel prep as instructed by your doctor    No test required before your procedure.      If you have any scheduling concerns or procedure questions (including questions after your procedure) please contact your physician's office. Dolph Carrier, MD  Phone Number: [phone]     Day of Procedure Patient Instruction:  Do not smoke, vape, chew tobacco, drink alcohol or use recreational drugs on the day of your procedure  Remove all jewelry, piercings, and metal accessories  Do not wear artificial nails and only clear nail polish on natural nails. Nails must be trimmed to fingertip length to make sure the oxygen probe fits properly and to avoid  injury  Do not wear artificial eye lashes because they can cause dry eyes, eye scratches, eye infections and allergic reactions  Do not use hair extensions with metal clips or hairstyles near the back of your neck, as it can make it difficult to safely manage your breathing during anesthesia  If your hair is tightly braided or styled in a complex way, it may need to be undone for your safety  Do not wear contacts, tampons, make-up, lotions, creams, powders, fragrances or deodorant  Wear loose fit clothing  Do not bring valuables or money  Bring a copy of your Living Will and/or Medical Durable Power of Attorney if you have  one  Bring a list of current medications including name and dosage  Bring a picture ID and insurance card     Refer to the doctor's office for day of procedure recommended clothing          If you are going home the same day as your procedure, a support person should accompany you to the facility and must transport you home.  If you plan to take public transportation of any sort, your support person must accompany you home.  You should have someone to stay with you for 24 hours after your procedure with sedation of any kind.       Comments:     This information was reviewed with you during your Pre-Admission Testing interview and you verbalized understanding. If you have any additional questions please contact [phone].    To pre-register for your procedure OR for billing estimate questions please call [phone] Option 2 and then Option 3.    For financial questions AFTER your procedure is complete please contact [phone].    For financial questions regarding anesthesia at a [facility] facility, please  contact [phone].    For MyChart Patient Portal help please call [phone].    For hotel accommodations please visit [email or web address] and select PATIENTS &VISITORS -> "FOR VISITORS" section and then TRAVEL INFORMATION -> PLACES TO STAY

## 2024-01-23 NOTE — Telephone Encounter (Signed)
 Frank Harris (DOB:  03-17-1981) is a 43 y.o. male    Has been scheduled for Colonoscopy on 02/02/2024    Arrival Time 9:00am Procedure Time 11:00am    PLACE OF SERVICE LOCATION: [facility]    ANESTHESIA : MAC Sedation    PREPARATION: GoLytely   1 day    SPECIAL INSTRUCTIONS: NONE    LENGTH OF Procedure: 30 Min    Prep Sent? Yes this is a reschedule patient.

## 2024-01-24 ENCOUNTER — Ambulatory Visit
Admit: 2024-01-24 | Discharge: 2024-01-24 | Payer: PRIVATE HEALTH INSURANCE | Attending: Surgery | Primary: Family Medicine

## 2024-01-24 MED ORDER — GOLYTELY 236 G PO SOLR
236 | Freq: Once | ORAL | 0 refills | 1.00000 days | Status: AC
Start: 2024-01-24 — End: 2024-01-24

## 2024-01-24 NOTE — Telephone Encounter (Signed)
 [facility] called to let Dr. Sheffield know that Frank Harris call them and noted blood in his ileostomy bag. They were not sure if the ostomy nurse could go out to see him today at home. I called patient and made him an appointment with Dr. Sheffield partners Dr. Mertha for today 01/24/2024.

## 2024-01-29 NOTE — Progress Notes (Signed)
 [facility] Note          Chief Complaint   Patient presents with    Post-Op Check     Stoma started bleeding today       PCP: Patton Lauraine HERO, MD    HPI: Frank Harris is a 43 y.o. 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  He is here today as he is having some blood in his ostomy bag, which he states started about 2 hours ago.  No clots, just bloody appearing ostomy output.  He denies any dizziness, weakness, or other signs of hypotension.  He states that he changed his bag this morning.      PHYSICAL EXAM:  Gen: No acute distress  Neuro: alert and oriented  HEENT: no scleral icterus  CV: RRR  Lungs: non-labored breathing  Abd: soft , non-tender, well healed incision; RLQ ileostomy with some peristomal skin irritation and bleeding - one area with active oozing  GU: deferred  MSK: moving all extremities       Assessment/Plan:   Frank Harris is a 43 y.o. 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  I believe that his bleeding is secondary to skin irritation from stoma output.  I cauterized this area with silver nitrate in the office and it was hemostatic.  I discussed with him that he should try cutting his bag slightly smaller to protect the skin. He should follow up as scheduled with Dr. Sheffield.     Slater Peto, MD          NOTE: This report, in part or full,may have been transcribed using voice recognition software. Every effort was made to ensure accuracy; however, inadvertent computerized transcription errors may be present. Please excuse any transcriptional grammatical or spelling errors that may have escaped my editorial review.

## 2024-02-02 ENCOUNTER — Inpatient Hospital Stay: Payer: PRIVATE HEALTH INSURANCE

## 2024-02-02 LAB — POCT GLUCOSE: POC Glucose: 80 mg/dL (ref 65.0–110.0)

## 2024-02-02 MED ORDER — SODIUM CHLORIDE 0.9 % IV SOLN
0.9 | INTRAVENOUS | Status: DC | PRN
Start: 2024-02-02 — End: 2024-02-02

## 2024-02-02 MED ORDER — NORMAL SALINE FLUSH 0.9 % IV SOLN
0.9 | Freq: Two times a day (BID) | INTRAVENOUS | Status: DC
Start: 2024-02-02 — End: 2024-02-02

## 2024-02-02 MED ORDER — PROPOFOL 200 MG/20ML IV EMUL
200 | Freq: Once | INTRAVENOUS | Status: DC | PRN
Start: 2024-02-02 — End: 2024-02-02
  Administered 2024-02-02: 16:00:00 20 via INTRAVENOUS
  Administered 2024-02-02: 16:00:00 70 via INTRAVENOUS
  Administered 2024-02-02: 16:00:00 30 via INTRAVENOUS
  Administered 2024-02-02: 16:00:00 20 via INTRAVENOUS

## 2024-02-02 MED ORDER — LIDOCAINE HCL (PF) 1 % IJ SOLN
1 | Freq: Once | INTRAMUSCULAR | Status: DC | PRN
Start: 2024-02-02 — End: 2024-02-02

## 2024-02-02 MED ORDER — LACTATED RINGERS IV SOLN
INTRAVENOUS | Status: DC
Start: 2024-02-02 — End: 2024-02-02
  Administered 2024-02-02: 13:00:00 via INTRAVENOUS

## 2024-02-02 MED ORDER — LIDOCAINE HCL (PF) 2 % IJ SOLN
2 | Freq: Once | INTRAMUSCULAR | Status: DC | PRN
Start: 2024-02-02 — End: 2024-02-02
  Administered 2024-02-02: 16:00:00 60 via INTRAVENOUS

## 2024-02-02 MED ORDER — NORMAL SALINE FLUSH 0.9 % IV SOLN
0.9 | INTRAVENOUS | Status: DC | PRN
Start: 2024-02-02 — End: 2024-02-02

## 2024-02-02 MED ORDER — LACTATED RINGERS IV SOLN
INTRAVENOUS | Status: DC
Start: 2024-02-02 — End: 2024-02-02

## 2024-02-02 MED ORDER — ONDANSETRON HCL 4 MG/2ML IJ SOLN
4 | Freq: Once | INTRAMUSCULAR | Status: DC | PRN
Start: 2024-02-02 — End: 2024-02-02

## 2024-02-02 MED ORDER — SODIUM CHLORIDE (PF) 0.9 % IJ SOLN
0.9 | INTRAMUSCULAR | Status: DC | PRN
Start: 2024-02-02 — End: 2024-02-02

## 2024-02-02 NOTE — Progress Notes (Signed)
 Received fax from [facility] [employer] regarding ostomy supplies-signed, scanned and faxed back

## 2024-02-02 NOTE — Anesthesia Post-Procedure Evaluation (Signed)
 Department of Anesthesiology  Postprocedure Note    Patient: Frank Harris  MRN: [id number]  Birthdate: April 19, 1981  Date of evaluation: 02/02/2024    Procedure Summary       Date: 02/02/24 Room / Location: [facility] / [facility]    Anesthesia Start: 1141 Anesthesia Stop: 1217    Procedure: COLORECTAL CANCER SCREENING, NOT HIGH RISK Diagnosis:       Screening for colon cancer      (Screening for colon cancer [Z12.11])    Surgeons: Dolph Carrier, MD Responsible Provider: Verena Lonni JINNY, DO    Anesthesia Type: MAC ASA Status: 2            Anesthesia Type: MAC    Aldrete Phase I:      Aldrete Phase II: Aldrete Score: 8    Anesthesia Post Evaluation    Patient location during evaluation: PACU  Patient participation: complete - patient participated  Level of consciousness: awake  Pain score: 0  Airway patency: patent  Nausea & Vomiting: no nausea and no vomiting  Cardiovascular status: hemodynamically stable  Respiratory status: room air  Hydration status: euvolemic  Pain management: adequate      There were no known notable events for this encounter.

## 2024-02-02 NOTE — Anesthesia Pre-Procedure Evaluation (Addendum)
 Department of Anesthesiology  Preprocedure Note       Name:  Frank Harris   Age:  43 y.o.  DOB:  08/21/1980                                          MRN:  [id number]         Date:  02/02/2024      Surgeon: Clotilde):  Dolph Carrier, MD    Procedure: Procedure(s):  COLORECTAL CANCER SCREENING, NOT HIGH RISK    Medications prior to admission:   Prior to Admission medications   Medication Sig Start Date End Date Taking? Authorizing Provider   losartan  (COZAAR ) 100 MG tablet Take 1 tablet by mouth daily 01/15/24  Yes Crickman, Lauraine HERO, MD   gabapentin  (NEURONTIN ) 100 MG capsule Take 2 capsules by mouth 3 times daily for 60 days. 01/09/24 03/09/24 Yes Delores Belvie NOVAK, MD   Fexofenadine-Pseudoephedrine (ALLEGRA-D 12 HOUR PO) Take 1 tablet by mouth as needed in the morning and 1 tablet as needed in the evening (Allergies).   Yes [provider]   amLODIPine  (NORVASC ) 10 MG tablet TAKE 1 TABLET BY MOUTH EVERY DAY 12/07/23  Yes Crickman, Lauraine HERO, MD   albuterol  sulfate HFA (PROVENTIL ;VENTOLIN ;PROAIR ) 108 (90 Base) MCG/ACT inhaler 2-4 puff as needed Inhalation every 4 hrs for 30 days 12/07/23  Yes Crickman, Lauraine HERO, MD   metFORMIN  (GLUCOPHAGE -XR) 500 MG extended release tablet Take 1 tablet by mouth daily (with breakfast) 12/07/23  Yes Patton Lauraine HERO, MD   fluticasone  (FLONASE ) 50 MCG/ACT nasal spray 2 sprays by Each Nostril route daily 12/07/23  Yes Crickman, Lauraine HERO, MD   acetaminophen  (TYLENOL ) 650 MG extended release tablet Take 1 tablet by mouth every 8 hours as needed for Pain   Yes [provider]   lidocaine  (LIDODERM ) 5 % Place 1 patch onto the skin daily 12 hours on, 12 hours off.  Patient not taking: Reported on 01/24/2024    [provider]       Current medications:    Current Facility-Administered Medications   Medication Dose Route Frequency Provider Last Rate Last Admin   . sodium chloride  flush 0.9 % injection 5-40 mL  5-40 mL IntraVENous 2 times per day Merriam Brandner, Lonni JINNY, DO       . sodium chloride  flush 0.9 % injection 5-40 mL  5-40 mL IntraVENous PRN Danasha Melman J, DO       . 0.9 % sodium chloride  infusion   IntraVENous PRN Dashun Borre J, DO       . lidocaine  PF 1 % injection 1 mL  1 mL IntraDERmal Once PRN Michaiah Holsopple, Lonni JINNY, DO       . lactated ringers  infusion   IntraVENous Continuous Reichstein, Jonathan, MD       . sodium chloride  flush 0.9 % injection 5-40 mL  5-40 mL IntraVENous 2 times per day Dolph Carrier, MD       . sodium chloride  flush 0.9 % injection 5-40 mL  5-40 mL IntraVENous PRN Dolph Carrier, MD       . 0.9 % sodium chloride  infusion  25 mL IntraVENous PRN Dolph Carrier, MD       . lactated ringers  infusion   IntraVENous Continuous Dolph Carrier, MD 100 mL/hr at 02/02/24 0926 New Bag at 02/02/24 9073       Allergies:  Allergies   Allergen Reactions   . Shellfish Allergy Hives, Itching, Rash, Shortness Of Breath and Swelling       Problem List:    Patient Active Problem List   Diagnosis Code   . Alpha thalassemia D56.0   . Thalassemia D56.9   . 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   . Daytime sleepiness R40.0   . Essential (primary) hypertension I10   . Family history of sarcoidosis Z83.2   . Gastroesophageal reflux disease K21.9   . GERD without esophagitis K21.9   . Hypertriglyceridemia E78.1   . Lymphocytosis D72.820   . Microcytic anemia D50.9   . Microcytic red blood cells R71.8   . Obesity with body mass index 30 or greater E66.9   . Seasonal allergies J30.2   . Type 2 diabetes mellitus without complication (HCC) E11.9   . Other obesity due to excess calories E66.09   . Vitamin D deficiency E55.9   . Acute conjunctivitis of both eyes H10.33   . COVID-19 U07.1   . Low back pain M54.50   . Myalgia, unspecified site M79.10   . Neck pain M54.2   . Diverticulitis of colon with perforation K57.20   . Intestinal obstruction (HCC) K56.609   . Diverticulitis K57.92   . Screening for colon cancer Z12.11       Past  Medical History:        Diagnosis Date   . Diabetes mellitus, type 2 (HCC)    . Hypertension, essential    . OSA treated with BiPAP    . Sarcoidosis     lungs       Past Surgical History:        Procedure Laterality Date   . FRACTURE SURGERY Right     leg   . LAPAROSCOPY N/A 01/05/2024    LAPAROSCOPY DIAGNOSTIC performed by Sheffield Donnajean HERO, MD at [facility]   . LAPAROTOMY N/A 01/05/2024    LAPAROTOMY EXPLORATORY, SIGMOIDECTOMY WITH PRIMARY ANASTAMOSIS, CREATION OF DIVERTING LOOP ILEOSTOMY performed by Clevenger, Donnajean HERO, MD at [facility]       Social History:    Social History     Tobacco Use   . Smoking status: Former     Current packs/day: 0.00     Average packs/day: 0.5 packs/day for 16.0 years (8.0 ttl pk-yrs)     Types: Cigarettes     Start date: 06/13/1993     Quit date: 2011     Years since quitting: 14.6   . Smokeless tobacco: Never   Substance Use Topics   . Alcohol use: Not Currently                                Counseling given: Not Answered      Vital Signs (Current):   Vitals:    01/23/24 1525 02/02/24 0921   BP:  128/86   Pulse:  57   Resp:  16   Temp:  98.3 F (36.8 C)   TempSrc:  Oral   SpO2:  97%   Weight: 104.3 kg (230 lb) 98 kg (216 lb 0.8 oz)   Height: 1.651 m (5' 5)                                               BP  Readings from Last 3 Encounters:   02/02/24 128/86   01/24/24 123/79   01/15/24 (!) 142/80       NPO Status: Time of last liquid consumption: 2200                        Time of last solid consumption: 2200                        Date of last liquid consumption: 02/01/24                        Date of last solid food consumption: 02/02/24    BMI:   Wt Readings from Last 3 Encounters:   02/02/24 98 kg (216 lb 0.8 oz)   01/24/24 99.3 kg (219 lb)   01/15/24 99.7 kg (219 lb 12.8 oz)     Body mass index is 35.95 kg/m.    CBC:   Lab Results   Component Value Date/Time    WBC 5.7 01/15/2024 10:02 AM    RBC 5.97 01/15/2024 10:02 AM    HGB 13.4 01/15/2024 10:02 AM    HGB Negative  01/25/2019 09:48 AM    HCT 43.2 01/15/2024 10:02 AM    MCV 72.4 01/15/2024 10:02 AM    RDW 15.8 01/15/2024 10:02 AM    PLT 400 01/15/2024 10:02 AM       CMP:   Lab Results   Component Value Date/Time    NA 139 01/15/2024 10:02 AM    K 4.6 01/15/2024 10:02 AM    CL 102 01/15/2024 10:02 AM    CO2 24 01/15/2024 10:02 AM    BUN 17 01/15/2024 10:02 AM    CREATININE 1.2 01/15/2024 10:02 AM    GFRAA 122 08/17/2020 01:10 PM    LABGLOM 76 01/15/2024 10:02 AM    LABGLOM 95 09/23/2022 09:45 AM    GLUCOSE 122 01/15/2024 10:02 AM    CALCIUM 9.8 01/15/2024 10:02 AM    BILITOT 0.31 01/15/2024 10:02 AM    ALKPHOS 76 01/15/2024 10:02 AM    AST 28 01/15/2024 10:02 AM    ALT 51 01/15/2024 10:02 AM       POC Tests:   Recent Labs     02/02/24  0917   POCGLU 80.0       Coags:   Lab Results   Component Value Date/Time    PROTIME 14.5 01/05/2024 06:24 AM    INR 1.1 01/05/2024 06:24 AM       HCG (If Applicable): No results found for: PREGTESTUR, PREGSERUM, HCG, HCGQUANT     ABGs: No results found for: PHART, PO2ART, PCO2ART, HCO3ART, BEART, O2SATART     Type & Screen (If Applicable):  Lab Results   Component Value Date    ABORH B POS 01/05/2024    LABANTI Negative 01/05/2024       Drug/Infectious Status (If Applicable):  No results found for: HIV, HEPCAB    COVID-19 Screening (If Applicable): No results found for: COVID19        Anesthesia Evaluation  Patient summary reviewed and Nursing notes reviewed   no history of anesthetic complications:   Airway: Mallampati: III  TM distance: >3 FB   Neck ROM: full  Comment: Full beard  Mouth opening: > = 3 FB   Dental:          Pulmonary:normal exam    (+)     sleep  apnea (uses CPAP intermittently): on noncompliant,           (-) asthma and recent URI                           Cardiovascular:    (+) hypertension:    (-) CAD, CABG/stent,  CHF, orthopnea and peripheral edema      Rhythm: regular  Rate: normal                    Neuro/Psych:      (-) seizures, CVA and  psychiatric history           GI/Hepatic/Renal:   (+) GERD:, bowel prep     (-) liver disease and no renal disease      ROS comment: S/p ex lap, ileostomy.   Endo/Other:    (+) Diabetes (A1c 6.8, metformin )Type II DM.    (-) hypothyroidism, hyperthyroidism, blood dyscrasia               Abdominal: normal exam            Vascular:     - DVT and PE.      Other Findings:             Anesthesia Plan      MAC     ASA 2       Induction: intravenous.      Anesthetic plan and risks discussed with patient.      Plan discussed with attending.    Attending anesthesiologist reviewed and agrees with Preprocedure content                Lonni PARAS Nathanael Krist, DO   02/02/2024

## 2024-02-02 NOTE — H&P (Signed)
 GI PRE-PROCEDURE HISTORY AND PHYSICAL     Frank Harris  presents for the following scheduled GI procedure(s):      Diagnostic colonoscopy    Colonoscopy Pre-Procedure Quality Metrics:  Indication for Colonoscopy: Diagnostic colonoscopy     The indication for the procedure is follow up of complicated (perforation) diverticulitis s/p sigmoidectomy and colostomy (01/05/24).     I have reviewed the patient's history and physical and I have re-examined the patient prior to the procedure.   There have been no significant recent changes in the patient's medical status.    History:  Past Medical History:   Diagnosis Date    Diabetes mellitus, type 2 (HCC)     Hypertension, essential     OSA treated with BiPAP     Sarcoidosis     lungs        Past Surgical History:   Procedure Laterality Date    FRACTURE SURGERY Right     leg    LAPAROSCOPY N/A 01/05/2024    LAPAROSCOPY DIAGNOSTIC performed by Sheffield Donnajean HERO, MD at [facility]    LAPAROTOMY N/A 01/05/2024    LAPAROTOMY EXPLORATORY, SIGMOIDECTOMY WITH PRIMARY ANASTAMOSIS, CREATION OF DIVERTING LOOP ILEOSTOMY performed by Sheffield Donnajean HERO, MD at [facility]        Allergies  Allergies   Allergen Reactions    Shellfish Allergy Hives, Itching, Rash, Shortness Of Breath and Swelling        Medications    Current Facility-Administered Medications:     sodium chloride  flush 0.9 % injection 5-40 mL, 5-40 mL, IntraVENous, 2 times per day, Depietro, Lonni JINNY, DO    sodium chloride  flush 0.9 % injection 5-40 mL, 5-40 mL, IntraVENous, PRN, Depietro, Christopher J, DO    0.9 % sodium chloride  infusion, , IntraVENous, PRN, Depietro, Christopher J, DO    lidocaine  PF 1 % injection 1 mL, 1 mL, IntraDERmal, Once PRN, Depietro, Lonni JINNY, DO    lactated ringers  infusion, , IntraVENous, Continuous, Dolph, Aretta Stetzel, MD    sodium chloride  flush 0.9 % injection 5-40 mL, 5-40 mL, IntraVENous, 2 times per day, Dolph Carrier, MD    sodium chloride  flush 0.9 %  injection 5-40 mL, 5-40 mL, IntraVENous, PRN, Ezella Kell, MD    0.9 % sodium chloride  infusion, 25 mL, IntraVENous, PRN, Dolph Carrier, MD    lactated ringers  infusion, , IntraVENous, Continuous, Dolph Carrier, MD, Last Rate: 100 mL/hr at 02/02/24 0926, New Bag at 02/02/24 9073     Recent Anticoagulant or Antiplatelet Use  The patient has taken no previous anticoagulant or antiplatelet agents      Prophylactic Antibiotics  The patient does not require prophylactic antibiotics.    Physical Examination  Vitals:    02/02/24 0921   BP: 128/86   Pulse: 57   Resp: 16   Temp: 98.3 F (36.8 C)   SpO2: 97%      Mental Status: normal  Airway: normal oropharyngeal airway and neck mobility  Lungs: No increased WOB   Heart: RRR  Abdomen: soft, nondistended    ASSESSMENT AND PLAN   Frank Harris has been evaluated prior to the planned procedure and deemed appropriate to undergo a Procedure(s):  Diagnostic colonoscopy

## 2024-02-05 ENCOUNTER — Encounter

## 2024-02-16 ENCOUNTER — Inpatient Hospital Stay
Admit: 2024-02-16 | Payer: PRIVATE HEALTH INSURANCE | Attending: Student in an Organized Health Care Education/Training Program | Primary: Family Medicine

## 2024-02-16 DIAGNOSIS — Z9889 Other specified postprocedural states: Principal | ICD-10-CM

## 2024-02-16 DIAGNOSIS — Z9049 Acquired absence of other specified parts of digestive tract: Secondary | ICD-10-CM

## 2024-02-16 LAB — HM DIABETES EYE EXAM: Diabetic Retinopathy: NEGATIVE

## 2024-02-16 MED ORDER — IOPAMIDOL 61 % IV SOLN
61 | INTRAVENOUS | Status: DC | PRN
Start: 2024-02-16 — End: 2024-02-20
  Administered 2024-02-16: 14:00:00 400

## 2024-02-16 MED ORDER — DIATRIZOATE MEGLUMINE 30 % UR SOLN
30 | URETHRAL | Status: DC | PRN
Start: 2024-02-16 — End: 2024-02-20
  Administered 2024-02-16: 14:00:00 900 via RECTAL

## 2024-02-19 ENCOUNTER — Encounter

## 2024-02-19 NOTE — Progress Notes (Signed)
 Pre Procedure Patient Instructions     Procedure Location hospital:[facility]: [address], [place] in the [facility] [address] [place]. Bring your parking ticket with you inside to be validated. Take the covered pathway to the Admitting Entrance.  Follow the signs to the Public Elevators/Lobby. Take the lobby elevators to the 7th Floor. Enter the Procedural Waiting Room, directly in front of you and check in.   Procedure Date 9/15  Arrival Time 0730    Your doctor determines your scheduled start time.  Depending on the facility where your procedure is taking place and the scheduled start time, you may be required to arrive up to 2 hours prior.  This is to allow time for registration, your preop assessment, any day of procedure testing and to meet with your care teams members.  This also allows your procedure to be performed earlier should there be a cancellation that day.  We appreciate your patience as we work to provide you with excellent service.    Medications: Follow the medication instructions below to prevent your procedure from being cancelled.    Medication to be taken the morning of surgery with a few sips of water only: AMLODIPINE     Continue to take your medications as prescribed, with the following exceptions:     Hold or reduce the dosage of the following medication, as discussed:     Take METFORMIN , LOSARTAN , AND ALLEGRA-D the day/night before as per usual.  DO NOT take the morning of procedure.      Do not take over the counter pain medications except plain Tylenol  or Acetaminophen  for seven days prior to your procedure unless your doctor told you otherwise.    If you are taking any diabetes and or weight loss medications (including injectables and shots) not discussed during the call with the PreAdmission Nurse, it is important to call [phone] to discuss important instructions.    If you are taking blood thinners, and have not been told when to hold the dose, call the  doctor performing your procedure for instructions on when to stop.    On the day of your procedure, a nurse will review your medications and ask when you last took each one.        Procedure Preparation    Diet Restrictions:No food or drink including gum or mints -after midnight the day of your procedure.      If you have questions on the day of your procedure call [facility] 7th floor Preop at [phone].    Skin Preparation:  Wash with Hibiclens or an antibacterial soap (e.g. Dial soap) the night before and morning of procedure., Using a clean washcloth, wash from neck to toes for 3 minutes. Gently clean the area where your surgery will be done thoroughly., and Do not use Hibiclens on or near your face, eyes, ears or head.    Do not put on any deodorants, lotions, powders, or oils on the day of procedure.  Be sure to put on clean, comfortable loose fitting clothing.    Other Preparation:  Call your doctor right away if you have any fever, cold or flu symptoms      No test required before your procedure.      If you have any scheduling concerns or procedure questions (including questions after your procedure) please contact your physician's office. Sheffield Donnajean HERO, MD  Phone Number: [phone]     Day of Procedure Patient Instruction:  Do not smoke, vape,  chew tobacco, drink alcohol or use recreational drugs on the day of your procedure  Remove all jewelry, piercings, and metal accessories  Do not wear artificial nails and only clear nail polish on natural nails. Nails must be trimmed to fingertip length to make sure the oxygen probe fits properly and to avoid injury  Do not wear artificial eye lashes because they can cause dry eyes, eye scratches, eye infections and allergic reactions  Do not use hair extensions with metal clips or hairstyles near the back of your neck, as it can make it difficult to safely manage your breathing during anesthesia  If your hair is tightly braided or styled in a complex  way, it may need to be undone for your safety  Do not wear contacts, tampons, make-up, lotions, creams, powders, fragrances or deodorant  Wear loose fit clothing  Do not bring valuables or money  Bring a copy of your Living Will and/or Medical Durable Power of Attorney if you have one  Bring a list of current medications including name and dosage  Bring a picture ID and insurance card     Bring your CPAP or BiPAP machine.  Your machine may be needed in recovery following surgery and will be needed if staying overnight.          If you are going home the same day as your procedure, a support person should accompany you to the facility and must transport you home.  If you plan to take public transportation of any sort, your support person must accompany you home.  You should have someone to stay with you for 24 hours after your procedure with sedation of any kind.       This information was reviewed with you during your Pre-Admission Testing interview and you verbalized understanding. If you have any additional questions please contact [phone].    To pre-register for your procedure OR for billing estimate questions please call [phone] Option 2 and then Option 3.    For financial questions AFTER your procedure is complete please contact [phone].    For financial questions regarding anesthesia at a [facility] facility, please  contact [phone].    For MyChart Patient Portal help please call [phone].    For hotel accommodations please visit [email or web address] and select PATIENTS &VISITORS -> "FOR VISITORS" section and then TRAVEL INFORMATION -> PLACES TO STAY

## 2024-02-21 ENCOUNTER — Ambulatory Visit: Payer: PRIVATE HEALTH INSURANCE

## 2024-02-21 NOTE — Progress Notes (Signed)
 Home health certification plan of care reviewed and signed today.

## 2024-02-26 ENCOUNTER — Inpatient Hospital Stay
Admit: 2024-02-26 | Discharge: 2024-02-27 | Disposition: A | Discharge status: Home / Self Care | Payer: PRIVATE HEALTH INSURANCE | Attending: Student in an Organized Health Care Education/Training Program | Admitting: Student in an Organized Health Care Education/Training Program

## 2024-02-26 DIAGNOSIS — K5792 Diverticulitis of intestine, part unspecified, without perforation or abscess without bleeding: Principal | ICD-10-CM

## 2024-02-26 DIAGNOSIS — Z432 Encounter for attention to ileostomy: Secondary | ICD-10-CM

## 2024-02-26 LAB — ABO/RH: ABO/Rh: B POS

## 2024-02-26 LAB — POCT GLUCOSE
POC Glucose: 83 mg/dL (ref 65.0–110.0)
POC Glucose: 105 mg/dL (ref 65.0–110.0)
POC Glucose: 105 mg/dL (ref 65.0–110.0)

## 2024-02-26 LAB — ANTIBODY SCREEN: Antibody Screen: NEGATIVE

## 2024-02-26 MED ORDER — ONDANSETRON HCL 4 MG/2ML IJ SOLN
4 | Freq: Once | INTRAMUSCULAR | Status: DC | PRN
Start: 2024-02-26 — End: 2024-02-26
  Administered 2024-02-26: 14:00:00 4 via INTRAVENOUS

## 2024-02-26 MED ORDER — HALOPERIDOL LACTATE 5 MG/ML IJ SOLN
5 | INTRAMUSCULAR | Status: DC | PRN
Start: 2024-02-26 — End: 2024-02-26

## 2024-02-26 MED ORDER — AMLODIPINE BESYLATE 10 MG PO TABS
10 | Freq: Every day | ORAL | Status: DC
Start: 2024-02-26 — End: 2024-02-27
  Administered 2024-02-27: 13:00:00 10 mg via ORAL

## 2024-02-26 MED ORDER — POTASSIUM CHLORIDE CRYS ER 20 MEQ PO TBCR
20 | ORAL | Status: DC | PRN
Start: 2024-02-26 — End: 2024-02-27

## 2024-02-26 MED ORDER — MIDAZOLAM HCL 2 MG/2ML IJ SOLN
2 | Freq: Once | INTRAMUSCULAR | Status: DC | PRN
Start: 2024-02-26 — End: 2024-02-26
  Administered 2024-02-26 (×2): 1 via INTRAVENOUS

## 2024-02-26 MED ORDER — GLUCOSE 4 G PO CHEW
4 | ORAL | Status: DC | PRN
Start: 2024-02-26 — End: 2024-02-27

## 2024-02-26 MED ORDER — DEXTROSE 10 % IV BOLUS
INTRAVENOUS | Status: DC | PRN
Start: 2024-02-26 — End: 2024-02-27

## 2024-02-26 MED ORDER — SUGAMMADEX SODIUM 200 MG/2ML IV SOLN
200 | Freq: Once | INTRAVENOUS | Status: DC | PRN
Start: 2024-02-26 — End: 2024-02-26
  Administered 2024-02-26: 14:00:00 200 via INTRAVENOUS

## 2024-02-26 MED ORDER — NORMAL SALINE FLUSH 0.9 % IV SOLN
0.9 | INTRAVENOUS | Status: DC | PRN
Start: 2024-02-26 — End: 2024-02-26

## 2024-02-26 MED ORDER — PROPOFOL 200 MG/20ML IV EMUL
200 | Freq: Once | INTRAVENOUS | Status: DC | PRN
Start: 2024-02-26 — End: 2024-02-26
  Administered 2024-02-26: 14:00:00 50 via INTRAVENOUS
  Administered 2024-02-26: 14:00:00 20 via INTRAVENOUS
  Administered 2024-02-26: 13:00:00 150 via INTRAVENOUS
  Administered 2024-02-26 (×2): 30 via INTRAVENOUS
  Administered 2024-02-26: 13:00:00 50 via INTRAVENOUS

## 2024-02-26 MED ORDER — HYDROMORPHONE HCL 1 MG/ML IJ SOLN
1 | INTRAMUSCULAR | Status: DC | PRN
Start: 2024-02-26 — End: 2024-02-27
  Administered 2024-02-26: 21:00:00 0.5 mg via INTRAVENOUS

## 2024-02-26 MED ORDER — HYDROMORPHONE HCL 1 MG/ML IJ SOLN
1 | Freq: Once | INTRAMUSCULAR | Status: DC | PRN
Start: 2024-02-26 — End: 2024-02-26
  Administered 2024-02-26: 14:00:00 .5 via INTRAVENOUS

## 2024-02-26 MED ORDER — MIDAZOLAM HCL 2 MG/2ML IJ SOLN
2 | INTRAMUSCULAR | Status: AC
Start: 2024-02-26 — End: 2024-02-26

## 2024-02-26 MED ORDER — ALBUTEROL SULFATE HFA 108 (90 BASE) MCG/ACT IN AERS
108 | Freq: Four times a day (QID) | RESPIRATORY_TRACT | Status: DC | PRN
Start: 2024-02-26 — End: 2024-02-27

## 2024-02-26 MED ORDER — ROCURONIUM BROMIDE 50 MG/5ML IV SOLN
50 | Freq: Once | INTRAVENOUS | Status: DC | PRN
Start: 2024-02-26 — End: 2024-02-26
  Administered 2024-02-26: 13:00:00 60 via INTRAVENOUS

## 2024-02-26 MED ORDER — NORMAL SALINE FLUSH 0.9 % IV SOLN
0.9 | Freq: Two times a day (BID) | INTRAVENOUS | Status: DC
Start: 2024-02-26 — End: 2024-02-26

## 2024-02-26 MED ORDER — FENTANYL CITRATE (PF) 100 MCG/2ML IJ SOLN
100 | INTRAMUSCULAR | Status: AC
Start: 2024-02-26 — End: 2024-02-26

## 2024-02-26 MED ORDER — POTASSIUM BICARB-CITRIC ACID 20 MEQ PO TBEF
20 | ORAL | Status: DC | PRN
Start: 2024-02-26 — End: 2024-02-27

## 2024-02-26 MED ORDER — LOSARTAN POTASSIUM 50 MG PO TABS
50 | Freq: Every day | ORAL | Status: DC
Start: 2024-02-26 — End: 2024-02-27
  Administered 2024-02-27: 13:00:00 100 mg via ORAL

## 2024-02-26 MED ORDER — SODIUM CHLORIDE 0.9 % IV SOLN
0.9 | INTRAVENOUS | Status: DC | PRN
Start: 2024-02-26 — End: 2024-02-26

## 2024-02-26 MED ORDER — SODIUM CHLORIDE 0.9 % IV SOLN
0.9 | INTRAVENOUS | Status: DC | PRN
Start: 2024-02-26 — End: 2024-02-27

## 2024-02-26 MED ORDER — POTASSIUM CHLORIDE 10 MEQ/100ML IV SOLN
10 | INTRAVENOUS | Status: DC | PRN
Start: 2024-02-26 — End: 2024-02-27

## 2024-02-26 MED ORDER — ACETAMINOPHEN 325 MG PO TABS
325 | Freq: Four times a day (QID) | ORAL | Status: DC
Start: 2024-02-26 — End: 2024-02-27
  Administered 2024-02-26 – 2024-02-27 (×4): 650 mg via ORAL

## 2024-02-26 MED ORDER — OXYCODONE HCL 5 MG PO TABS
5 | ORAL | Status: DC | PRN
Start: 2024-02-26 — End: 2024-02-27
  Administered 2024-02-26 – 2024-02-27 (×3): 10 mg via ORAL

## 2024-02-26 MED ORDER — GLUCAGON EMERGENCY 1 MG/ML IJ SOLR
1 | INTRAMUSCULAR | Status: DC | PRN
Start: 2024-02-26 — End: 2024-02-27

## 2024-02-26 MED ORDER — ONDANSETRON 4 MG PO TBDP
4 | Freq: Three times a day (TID) | ORAL | Status: DC | PRN
Start: 2024-02-26 — End: 2024-02-27

## 2024-02-26 MED ORDER — HYDROMORPHONE HCL 1 MG/ML IJ SOLN
1 | INTRAMUSCULAR | Status: AC
Start: 2024-02-26 — End: 2024-02-26

## 2024-02-26 MED ORDER — MAGNESIUM SULFATE 2 GM/50ML IV SOLN
2 | INTRAVENOUS | Status: DC | PRN
Start: 2024-02-26 — End: 2024-02-27

## 2024-02-26 MED ORDER — OXYCODONE HCL 5 MG PO TABS
5 | ORAL | Status: DC | PRN
Start: 2024-02-26 — End: 2024-02-27

## 2024-02-26 MED ORDER — NORMAL SALINE FLUSH 0.9 % IV SOLN
0.9 | INTRAVENOUS | Status: DC | PRN
Start: 2024-02-26 — End: 2024-02-27

## 2024-02-26 MED ORDER — FENTANYL CITRATE (PF) 100 MCG/2ML IJ SOLN
100 | INTRAMUSCULAR | Status: DC | PRN
Start: 2024-02-26 — End: 2024-02-26

## 2024-02-26 MED ORDER — SODIUM CHLORIDE 0.9 % IV SOLN (MINI-BAG)
0.9 | INTRAVENOUS | Status: AC
Start: 2024-02-26 — End: 2024-02-26
  Administered 2024-02-26: 14:00:00 2000 mg via INTRAVENOUS

## 2024-02-26 MED ORDER — NORMAL SALINE FLUSH 0.9 % IV SOLN
0.9 | Freq: Two times a day (BID) | INTRAVENOUS | Status: DC
Start: 2024-02-26 — End: 2024-02-27
  Administered 2024-02-27: 13:00:00 10 mL via INTRAVENOUS

## 2024-02-26 MED ORDER — HYDROMORPHONE HCL 1 MG/ML IJ SOLN
1 | INTRAMUSCULAR | Status: AC | PRN
Start: 2024-02-26 — End: 2024-02-26
  Administered 2024-02-26 (×2): 0.5 mg via INTRAVENOUS

## 2024-02-26 MED ORDER — ENOXAPARIN SODIUM 40 MG/0.4ML IJ SOSY
40 | Freq: Every day | INTRAMUSCULAR | Status: DC
Start: 2024-02-26 — End: 2024-02-27
  Administered 2024-02-27: 13:00:00 40 mg via SUBCUTANEOUS

## 2024-02-26 MED ORDER — FENTANYL CITRATE (PF) 100 MCG/2ML IJ SOLN
100 | Freq: Once | INTRAMUSCULAR | Status: DC | PRN
Start: 2024-02-26 — End: 2024-02-26
  Administered 2024-02-26 (×2): 50 via INTRAVENOUS

## 2024-02-26 MED ORDER — ONDANSETRON HCL 4 MG/2ML IJ SOLN
4 | Freq: Once | INTRAMUSCULAR | Status: DC | PRN
Start: 2024-02-26 — End: 2024-02-26

## 2024-02-26 MED ORDER — DEXAMETHASONE SODIUM PHOSPHATE 4 MG/ML IJ SOLN
4 | Freq: Once | INTRAMUSCULAR | Status: DC | PRN
Start: 2024-02-26 — End: 2024-02-26
  Administered 2024-02-26: 14:00:00 8 via INTRAVENOUS

## 2024-02-26 MED ORDER — LACTATED RINGERS IV SOLN
INTRAVENOUS | Status: DC
Start: 2024-02-26 — End: 2024-02-27
  Administered 2024-02-26: 17:00:00 via INTRAVENOUS

## 2024-02-26 MED ORDER — INSULIN LISPRO 100 UNIT/ML IJ SOLN
100 | Freq: Four times a day (QID) | INTRAMUSCULAR | Status: DC
Start: 2024-02-26 — End: 2024-02-27

## 2024-02-26 MED ORDER — BUPIVACAINE-EPINEPHRINE (PF) 0.5% -1:200000 IJ SOLN
INTRAMUSCULAR | Status: AC
Start: 2024-02-26 — End: 2024-02-26

## 2024-02-26 MED ORDER — LIDOCAINE HCL (PF) 2 % IJ SOLN
2 | Freq: Once | INTRAMUSCULAR | Status: DC | PRN
Start: 2024-02-26 — End: 2024-02-26
  Administered 2024-02-26: 13:00:00 100 via INTRAVENOUS

## 2024-02-26 MED ORDER — SENNA-DOCUSATE SODIUM 8.6-50 MG PO TABS
8.6-50 | Freq: Two times a day (BID) | ORAL | Status: DC
Start: 2024-02-26 — End: 2024-02-27
  Administered 2024-02-27 (×2): 1 via ORAL

## 2024-02-26 MED ORDER — DEXTROSE 10 % IV SOLN
10 | INTRAVENOUS | Status: DC | PRN
Start: 2024-02-26 — End: 2024-02-27

## 2024-02-26 MED ORDER — GABAPENTIN 100 MG PO CAPS
100 | Freq: Three times a day (TID) | ORAL | Status: DC
Start: 2024-02-26 — End: 2024-02-27
  Administered 2024-02-26 – 2024-02-27 (×3): 200 mg via ORAL

## 2024-02-26 MED ORDER — ONDANSETRON HCL 4 MG/2ML IJ SOLN
4 | Freq: Four times a day (QID) | INTRAMUSCULAR | Status: DC | PRN
Start: 2024-02-26 — End: 2024-02-27

## 2024-02-26 MED ORDER — LACTATED RINGERS IV SOLN
INTRAVENOUS | Status: DC | PRN
Start: 2024-02-26 — End: 2024-02-26
  Administered 2024-02-26: 13:00:00 via INTRAVENOUS

## 2024-02-26 MED ORDER — NALOXONE HCL 0.4 MG/ML IJ SOLN
0.4 | INTRAMUSCULAR | Status: DC | PRN
Start: 2024-02-26 — End: 2024-02-26

## 2024-02-26 MED FILL — ACETAMINOPHEN 325 MG PO TABS: 325 mg | ORAL | Qty: 2 | Fill #0

## 2024-02-26 MED FILL — DILAUDID 1 MG/ML IJ SOLN: 1 mg/mL | INTRAMUSCULAR | Qty: 0.5 | Fill #0

## 2024-02-26 MED FILL — SENSORCAINE-MPF/EPINEPHRINE 0.5% -1:200000 IJ SOLN: INTRAMUSCULAR | Qty: 30 | Fill #0

## 2024-02-26 MED FILL — GABAPENTIN 100 MG PO CAPS: 100 mg | ORAL | Qty: 2 | Fill #0

## 2024-02-26 MED FILL — CEFAZOLIN SODIUM 2 G IJ SOLR: 2 g | INTRAMUSCULAR | Qty: 2000 | Fill #0

## 2024-02-26 MED FILL — FENTANYL CITRATE (PF) 100 MCG/2ML IJ SOLN: 100 MCG/2ML | INTRAMUSCULAR | Qty: 2 | Fill #0

## 2024-02-26 MED FILL — OXYCODONE HCL 5 MG PO TABS: 5 mg | ORAL | Qty: 2 | Fill #0

## 2024-02-26 MED FILL — MIDAZOLAM HCL 2 MG/2ML IJ SOLN: 2 mg/mL | INTRAMUSCULAR | Qty: 2 | Fill #0

## 2024-02-26 NOTE — Anesthesia Pre-Procedure Evaluation (Signed)
 Department of Anesthesiology  Preprocedure Note       Name:  Frank Harris   Age:  43 y.o.  DOB:  [birth year]                                          MRN:  [id number]         Date:  02/26/2024      Surgeon: Clotilde):  Clevenger, Donnajean HERO, MD    Procedure: Procedure(s):  LAPAROSCOPIC ILEOSTOMY REVERSAL    Medications prior to admission:   Prior to Admission medications   Medication Sig Start Date End Date Taking? Authorizing Provider   losartan  (COZAAR ) 100 MG tablet Take 1 tablet by mouth daily 01/15/24  Yes Crickman, Lauraine HERO, MD   gabapentin  (NEURONTIN ) 100 MG capsule Take 2 capsules by mouth 3 times daily for 60 days. 01/09/24 03/09/24 Yes Delores Belvie NOVAK, MD   Fexofenadine-Pseudoephedrine (ALLEGRA-D 12 HOUR PO) Take 1 tablet by mouth as needed in the morning and 1 tablet as needed in the evening (Allergies).   Yes [provider]   amLODIPine  (NORVASC ) 10 MG tablet TAKE 1 TABLET BY MOUTH EVERY DAY  Patient taking differently: Take 1 tablet by mouth daily TAKE 1 TABLET BY MOUTH EVERY DAY 12/07/23  Yes Crickman, Lauraine HERO, MD   metFORMIN  (GLUCOPHAGE -XR) 500 MG extended release tablet Take 1 tablet by mouth daily (with breakfast) 12/07/23  Yes Patton Lauraine HERO, MD   fluticasone  (FLONASE ) 50 MCG/ACT nasal spray 2 sprays by Each Nostril route daily 12/07/23  Yes Crickman, Lauraine HERO, MD   lidocaine  (LIDODERM ) 5 % Place 1 patch onto the skin daily 12 hours on, 12 hours off.  Patient not taking: No sig reported    [provider]   albuterol  sulfate HFA (PROVENTIL ;VENTOLIN ;PROAIR ) 108 (90 Base) MCG/ACT inhaler 2-4 puff as needed Inhalation every 4 hrs for 30 days  Patient not taking: Reported on 02/19/2024 12/07/23   Patton Lauraine HERO, MD   acetaminophen  (TYLENOL ) 650 MG extended release tablet Take 1 tablet by mouth every 8 hours as needed for Pain  Patient not taking: Reported on 02/19/2024    [provider]       Current medications:    Current Facility-Administered Medications   Medication Dose  Route Frequency Provider Last Rate Last Admin   . sodium chloride  flush 0.9 % injection 5-40 mL  5-40 mL IntraVENous 2 times per day Clevenger, Donnajean HERO, MD       . sodium chloride  flush 0.9 % injection 5-40 mL  5-40 mL IntraVENous PRN Clevenger, Donnajean HERO, MD       . 0.9 % sodium chloride  infusion   IntraVENous PRN Clevenger, Donnajean HERO, MD       . ceFAZolin  (ANCEF ) 2,000 mg in sodium chloride  0.9 % 100 mL IVPB (mini-bag)  2,000 mg IntraVENous On Call to OR Clevenger, Donnajean HERO, MD           Allergies:    Allergies   Allergen Reactions   . Shellfish Allergy Hives, Itching, Rash, Shortness Of Breath and Swelling       Problem List:    Patient Active Problem List   Diagnosis Code   . Alpha thalassemia D56.0   . Thalassemia D56.9   . 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   . Daytime sleepiness R40.0   . Essential (primary) hypertension I10   .  Family history of sarcoidosis Z83.2   . Gastroesophageal reflux disease K21.9   . GERD without esophagitis K21.9   . Hypertriglyceridemia E78.1   . Lymphocytosis D72.820   . Microcytic anemia D50.9   . Microcytic red blood cells R71.8   . Obesity with body mass index 30 or greater E66.9   . Seasonal allergies J30.2   . Type 2 diabetes mellitus without complication (HCC) E11.9   . Other obesity due to excess calories E66.09   . Vitamin D deficiency E55.9   . Acute conjunctivitis of both eyes H10.33   . COVID-19 U07.1   . Low back pain M54.50   . Myalgia, unspecified site M79.10   . Neck pain M54.2   . Diverticulitis of colon with perforation K57.20   . Intestinal obstruction (HCC) K56.609   . Diverticulitis K57.92   . 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 Diabetes mellitus, type 2 (HCC)    . Hypertension, essential    . OSA treated with BiPAP    . Sarcoidosis     lungs       Past Surgical History:        Procedure Laterality Date   . COLONOSCOPY N/A 02/02/2024    COLORECTAL CANCER SCREENING, NOT HIGH RISK performed by Dolph Carrier, MD  at [facility]   . FRACTURE SURGERY Right     leg   . LAPAROSCOPY N/A 01/05/2024    LAPAROSCOPY DIAGNOSTIC performed by Sheffield Donnajean HERO, MD at [facility]   . LAPAROTOMY N/A 01/05/2024    LAPAROTOMY EXPLORATORY, SIGMOIDECTOMY WITH PRIMARY ANASTAMOSIS, CREATION OF DIVERTING LOOP ILEOSTOMY performed by Clevenger, Donnajean HERO, MD at [facility]       Social History:    Social History     Tobacco Use   . Smoking status: Former     Current packs/day: 0.00     Average packs/day: 0.5 packs/day for 16.0 years (8.0 ttl pk-yrs)     Types: Cigarettes     Start date: 06/13/1993     Quit date: 2011     Years since quitting: 14.7   . Smokeless tobacco: Never   Substance Use Topics   . Alcohol use: Not Currently                                Counseling given: Not Answered      Vital Signs (Current):   Vitals:    02/19/24 1006 02/26/24 0837   BP:  (!) 139/95   Pulse:  54   Resp:  16   Temp:  97 F (36.1 C)   TempSrc:  Oral   SpO2:  97%   Weight: 98 kg (216 lb) 100 kg (220 lb 8 oz)   Height: 1.651 m (5' 5) 1.651 m (5' 5)                                              BP Readings from Last 3 Encounters:   02/26/24 (!) 139/95   02/02/24 134/79   01/24/24 123/79       NPO Status: Time of last liquid consumption: 2340  Time of last solid consumption: 2000                        Date of last liquid consumption: 02/25/24                        Date of last solid food consumption: 02/25/24    BMI:   Wt Readings from Last 3 Encounters:   02/26/24 100 kg (220 lb 8 oz)   02/02/24 98 kg (216 lb 0.8 oz)   01/24/24 99.3 kg (219 lb)     Body mass index is 36.69 kg/m.    CBC:   Lab Results   Component Value Date/Time    WBC 5.7 01/15/2024 10:02 AM    RBC 5.97 01/15/2024 10:02 AM    HGB 13.4 01/15/2024 10:02 AM    HGB Negative 01/25/2019 09:48 AM    HCT 43.2 01/15/2024 10:02 AM    MCV 72.4 01/15/2024 10:02 AM    RDW 15.8 01/15/2024 10:02 AM    PLT 400 01/15/2024 10:02 AM       CMP:   Lab Results   Component Value Date/Time     NA 139 01/15/2024 10:02 AM    K 4.6 01/15/2024 10:02 AM    CL 102 01/15/2024 10:02 AM    CO2 24 01/15/2024 10:02 AM    BUN 17 01/15/2024 10:02 AM    CREATININE 1.2 01/15/2024 10:02 AM    GFRAA 122 08/17/2020 01:10 PM    LABGLOM 76 01/15/2024 10:02 AM    LABGLOM 95 09/23/2022 09:45 AM    GLUCOSE 122 01/15/2024 10:02 AM    CALCIUM 9.8 01/15/2024 10:02 AM    BILITOT 0.31 01/15/2024 10:02 AM    ALKPHOS 76 01/15/2024 10:02 AM    AST 28 01/15/2024 10:02 AM    ALT 51 01/15/2024 10:02 AM       POC Tests:   Recent Labs     02/26/24  0828   POCGLU 83.0       Coags:   Lab Results   Component Value Date/Time    PROTIME 14.5 01/05/2024 06:24 AM    INR 1.1 01/05/2024 06:24 AM       HCG (If Applicable): No results found for: PREGTESTUR, PREGSERUM, HCG, HCGQUANT     ABGs: No results found for: PHART, PO2ART, PCO2ART, HCO3ART, BEART, O2SATART     Type & Screen (If Applicable):  Lab Results   Component Value Date    ABORH B POS 01/05/2024    LABANTI Negative 01/05/2024       Drug/Infectious Status (If Applicable):  No results found for: HIV, HEPCAB    COVID-19 Screening (If Applicable): No results found for: COVID19        Anesthesia Evaluation          Airway:  Mallampati: II  TM distance: <3 FB   Neck ROM: full    Mouth opening: > = 3 FB   Dental:           Pulmonary:       (+)               sleep apnea: on CPAP                       PE comment: Non-labored, room air Cardiovascular:     Exercise tolerance: good (>4 METS)    (+)     hypertension: no interval  change                                            Rhythm: regular  Rate: normal                Neuro/Psych:               GI/Hepatic/Renal:    (+)   GERD: well controlled                Endo/Other:     (+) Diabetes: Type II DM, no interval change                       Abdominal:              Vascular:         Other Findings:  Patient awake, alert, and oriented. No gross neurological deficits appreciated. Focused physical exam as above.            Anesthesia Plan      general     ASA 2     (Patient was met in the pre-operative holding area. Verified correct patient and confirmed expected surgical procedure. Reviewed with patient their PMHx, PSHx, medications, allergies, and prior anesthetics. Verified that patient is appropriately NPO. Confirmed that correct home meds were held vs taken accordingly. Focused physical exam performed without unexpected findings.     Discussed plan for general anesthesia. Risks of general anesthesia discussed including minor and more common things such as pain, nausea, dental injury, sore throat as well as more serious but rare complications such as heart attack, stroke, and major bleeding. Patient expressed full understanding and is agreeable to proceed to OR. Consent form signed and placed in patient's chart.    )  Induction: intravenous.    MIPS: Postoperative opioids intended and Prophylactic antiemetics administered.  Anesthetic plan and risks discussed with patient.                      Norleen JONELLE Lien, MD   02/26/2024

## 2024-02-26 NOTE — Anesthesia Procedure Notes (Signed)
 Airway  Date/Time: 02/26/2024 9:20 AM  Reason: elective    Airway not difficult    General Information and Staff   Patient location during procedure: OR  Anesthesiologist: Melba Norleen SAUNDERS, MD  Performed: anesthesiologist   Performed by: Melba Norleen SAUNDERS, MD  Authorized by: Melba Norleen SAUNDERS, MD        Patient Condition  Indications for airway management: anesthesia  Patient position: sniffing  Sedation level: deep     Final Airway Details   Preoxygenated: yes  Final airway type: endotracheal airway  Successful airway: ETT  Cuffed: yes   Successful intubation technique: direct laryngoscopy  Endotracheal tube insertion site: oral  ETT size (mm): 7.0  Cormack-Lehane Classification: grade I - full view of glottis  Placement verified by: capnometry and palpation of cuff   Measured from: lips  ETT to lips (cm): 22  Number of attempts at approach: 1    Additional Comments  Pre-oxygenated. Uneventful induction of general anesthesia. Eyes closed with eye ovals. Somewhat difficult mask iso large beard, red OA + two hands + vent for positive pressure assistance. G1v VL Glidescope Stat 4. ETT passed easily via open cords. Atraumatic intubation, no obvious injury to teeth or oral soft tissues.    no

## 2024-02-26 NOTE — Anesthesia Post-Procedure Evaluation (Signed)
 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    Aldrete Phase I:      Aldrete Phase II:      Anesthesia Post Evaluation    Patient location during evaluation: PACU  Level of consciousness: sleepy but conscious  Airway patency: patent  Nausea & Vomiting: no nausea  Cardiovascular status: hemodynamically stable  Respiratory status: oral airway, spontaneous ventilation, face mask and acceptable  Comments: Patient transported from OR to recovery area with aid of nursing staff. Handoff given to PACU RN. Patient remains somewhat sedate, arouses mildly to stimuli but not yet opening eyes. OA in place, moving good air and maintaining SpO2 without issue via face mask. Vitals otherwise stable. No apparent pain or nausea.   Pain management: adequate      No notable events documented.

## 2024-02-26 NOTE — H&P (Signed)
 Surgery H&P    Frank Harris is feeling well today.  He is here for his ileostomy reversal.  He underwent barium enema and colonoscopy that did not show stricture of the sigmoid anastomosis, and the rest of his colon was clean.    See vitals.    Well-healed scar and stoma    Will proceed with ileostomy reversal.  I talked to him about this in detail. Risks and benefits of the procedure were explained to the patient and the patient understood and agreed to proceed with surgery, signing a written consent.

## 2024-02-26 NOTE — Op Note (Signed)
 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]    Post-Op Diagnosis: Same       Procedure(s):  ILEOSTOMY REVERSAL    Surgeon(s):  Mileena Rothenberger, Donnajean HERO, MD    Assistant:   * No surgical staff found *    Anesthesia: General    Estimated Blood Loss (mL): Minimal    Complications: None    Specimens:   ID Type Source Tests Collected by Time Destination   A : STOMA Tissue Ileum SURGICAL PATHOLOGY Maanya Hippert, Donnajean HERO, MD 03/07/2024 [phone]        Implants:  * No implants in log *      Drains:   [REMOVED] Closed/Suction Drain Lateral LLQ Bulb (Removed)       [REMOVED] Ileostomy/Jejunostomy RLQ Loop ileostomy (Removed)       Findings:  Present At Time Of Surgery (PATOS) (choose all levels that have infection present):  No infection present  Other Findings: Healthy appearing loop of ileum    Detailed Description of Procedure:   Patient was brought to the operating room.  General ET tube anesthesia was induced.  He was placed in the supine position.  His abdomen was prepped and draped in sterile fashion.  Preoperative antibiotics were administered.  A surgical timeout was performed.  I made an elliptical incision surrounding his right lower quadrant ileostomy.  The subcutaneous tissues were divided with electrocautery.  I cut sharply around the ileostomy close to the bowel until I reached the fascia.  At this point I was able to digitally break up the adhesions of the peritoneum until the entire loop of ileum was free.  I was able to bring part of this through his abdominal wall to fully inspect both sides.  I transected the stoma with 2 firings of the 75 mm blue load GIA stapler and suture-ligated the mesentery.  The stoma was passed off the field for surgical pathology.  Next I created a side-to-side stapled anastomosis using 1 more blue load of the 75 mm GIA stapler.  I sewed the  enterotomy closed using a 3-0 V-Loc suture in a Connel fashion, followed by reinforcement with Lembert stitches.  This left a nice looking patent anastomosis.  I replaced this into the intra-abdominal cavity then we all changed our gown and gloves.  The anterior fascia was then closed with 2 #0 STRATAFIX sutures.  This wound was irrigated thoroughly with the Irrisept solution.  Hemostasis was achieved with electrocautery.  Subcutaneous tissues were reapproximated with interrupted Vicryl sutures.  The skin was loosely reapproximated with interrupted Monocryl suture and a sterile dressing was applied.  The patient awoke from anesthesia and was extubated without issue and transferred to PACU in good condition, having tolerated the procedure well.    Electronically signed by Donnajean HERO Griffes, MD on 07-Mar-2024 at 12:06 PM

## 2024-02-26 NOTE — Care Coordination-Inpatient (Signed)
 02/26/24 1348   Service Assessment   Patient Orientation Alert and Oriented   Cognition Alert   History Provided By Patient;Spouse   Primary Caregiver Self   Accompanied By/Relationship spouse   Support Systems Spouse/Significant Other   Patient's Healthcare Decision Maker is: Legal Next of Kin   PCP Verified by CM Yes  (Dr. Patton)   Last Visit to PCP Within last 3 months   Prior Functional Level Independent in ADLs/IADLs   Current Functional Level Assistance with the following:;Housework   Can patient return to prior living arrangement Yes   Ability to make needs known: Good   Family able to assist with home care needs: Yes   Would you like for me to discuss the discharge plan with any other family members/significant others, and if so, who? Yes  (spouse Beckey [phone])   Social/Functional History   Lives With Spouse   Type of Home House   Occupation Full time employment   Discharge Planning   Living Arrangements Spouse/Significant Other   Current Services Prior To Admission C-pap     IP Ileostomy reversal. CM met w/ pt bedside for IA. Spouse/EC Alma present at bedside and will transport home/assist at dc. Prior IADLs and has CPAP only. Prior [facility] w/ [facility] and would [facility] again if needed[facility] [address] PCP Dr. Patton. No anticipated CM dc needs. Home w/ self-care and follow-up appt. (LN)

## 2024-02-27 LAB — BASIC METABOLIC PANEL W/ REFLEX TO MG FOR LOW K
Anion Gap: 7 mmol/L (ref 2–17)
BUN: 13 mg/dL (ref 6–20)
CO2: 25 mmol/L (ref 22–29)
Calcium: 8.9 mg/dL (ref 8.5–10.7)
Chloride: 102 mmol/L (ref 98–107)
Creatinine: 1.1 mg/dL (ref 0.7–1.3)
Est, Glom Filt Rate: 84 mL/min/1.73m (ref >=60)
Glucose: 90 mg/dL (ref 70–99)
Osmolaliy Calculated: 268 mosm/kg — ABNORMAL LOW (ref 270–287)
Potassium: 4.7 mmol/L (ref 3.5–5.3)
Sodium: 134 mmol/L — ABNORMAL LOW (ref 135–145)

## 2024-02-27 LAB — CBC WITH AUTO DIFFERENTIAL
Basophils %: 0 % (ref 0.0–2.0)
Basophils Absolute: 0 x10e3/mcL (ref 0.0–0.2)
Eosinophils %: 0 % (ref 0.0–7.0)
Eosinophils Absolute: 0 x10e3/mcL (ref 0.0–0.5)
Hematocrit: 39.6 % (ref 38.0–52.0)
Hemoglobin: 12.7 g/dL — ABNORMAL LOW (ref 13.0–17.3)
Immature Grans (Abs): 0.01 x10e3/mcL (ref 0.00–0.06)
Immature Granulocytes %: 0.1 % (ref 0.0–0.6)
Lymphocytes Absolute: 1.9 x10e3/mcL (ref 1.0–3.2)
Lymphocytes: 27.8 % (ref 15.0–45.0)
MCH: 22.9 pg — ABNORMAL LOW (ref 27.0–34.5)
MCHC: 32.1 g/dL (ref 30.0–36.0)
MCV: 71.4 fL — ABNORMAL LOW (ref 84.0–100.0)
MPV: 9.9 fL (ref 7.0–12.2)
Monocytes %: 10.1 % (ref 4.0–12.0)
Monocytes Absolute: 0.7 x10e3/mcL (ref 0.3–1.0)
NRBC Absolute: 0 x10e3/mcL (ref 0.000–0.012)
NRBC Automated: 0 % (ref 0.0–0.2)
Neutrophils %: 62 % (ref 42.0–74.0)
Neutrophils Absolute: 4.3 x10e3/mcL (ref 1.6–7.3)
Platelets: 229 x10e3/mcL (ref 140–440)
RBC: 5.55 x10e6/mcL (ref 4.00–5.60)
RDW: 15.3 % (ref 10.0–17.0)
WBC: 6.9 x10e3/mcL (ref 3.8–10.6)

## 2024-02-27 LAB — HEMOGLOBIN A1C
Estimated Avg Glucose: 128
Estimated Avg Glucose: 140
Hemoglobin A1C: 6.1 % — ABNORMAL HIGH (ref 4.0–6.0)

## 2024-02-27 LAB — POCT GLUCOSE
POC Glucose: 138 mg/dL — ABNORMAL HIGH (ref 65.0–110.0)
POC Glucose: 84 mg/dL (ref 65.0–110.0)

## 2024-02-27 LAB — MAGNESIUM: Magnesium: 1.9 mg/dL (ref 1.6–2.6)

## 2024-02-27 LAB — PHOSPHORUS: Phosphorus: 3.6 mg/dL (ref 2.5–4.5)

## 2024-02-27 MED ORDER — OXYCODONE HCL 5 MG PO TABS
5 | ORAL_TABLET | Freq: Four times a day (QID) | ORAL | 0 refills | 5.00000 days | Status: DC | PRN
Start: 2024-02-27 — End: 2024-02-28

## 2024-02-27 MED FILL — OXYCODONE HCL 5 MG PO TABS: 5 mg | ORAL | Qty: 2 | Fill #0

## 2024-02-27 MED FILL — GABAPENTIN 100 MG PO CAPS: 100 mg | ORAL | Qty: 2 | Fill #0

## 2024-02-27 MED FILL — AMLODIPINE BESYLATE 10 MG PO TABS: 10 mg | ORAL | Qty: 1 | Fill #0

## 2024-02-27 MED FILL — ACETAMINOPHEN 325 MG PO TABS: 325 mg | ORAL | Qty: 2 | Fill #0

## 2024-02-27 MED FILL — SENNOSIDES-DOCUSATE SODIUM 8.6-50 MG PO TABS: 8.6-50 mg | ORAL | Qty: 1 | Fill #0

## 2024-02-27 MED FILL — ENOXAPARIN SODIUM 40 MG/0.4ML IJ SOSY: 40 MG/0.4ML | INTRAMUSCULAR | Qty: 0.4 | Fill #0

## 2024-02-27 MED FILL — LOSARTAN POTASSIUM 50 MG PO TABS: 50 mg | ORAL | Qty: 2 | Fill #0

## 2024-02-27 NOTE — Progress Notes (Signed)
 General Surgery Progress Note    Subjective  POD 1 s/p ileostomy reversal.     Patient appears well this morning. Reports his pain is being appropriately managed. Ambulating the halls. Tolerated CLD overnight without N/V. Passing flatus and had a BM overnight.     Objective    Vital Signs:  Blood pressure (!) 148/92, pulse 53, temperature 97.9 F (36.6 C), temperature source Oral, resp. rate 17, height 1.651 m (5' 5), weight 100 kg (220 lb 8 oz), SpO2 97%.    Physical Exam:   General: Awake, Well-Nourished, Non-toxic. Lying in bed comfortably   Neuro: A&Ox4. Grossly intact.  Cardio: RRR.  Resp: Non-labored, Even. On RA  GI: Soft, Non-distended, Appropriately tender. Incision with scant serosanguinous drainage.   Peripheral: No edema.  Skin: Dry and intact.  Psych: Cooperative, Appropriate Mood and affect.      I/O last 3 completed shifts:  In: 1343.3 [I.V.:1243.3; IV Piggyback:100]  Out: 725 [Urine:725]  No intake/output data recorded.    Labs:  CBC:  Recent Labs     02/27/24  0441   WBC 6.9   RBC 5.55   HGB 12.7*   HCT 39.6   MCV 71.4*   RDW 15.3   PLT 229     CHEMISTRIES:  Recent Labs     02/27/24  0441   NA 134*   K 4.7   CL 102   CO2 25   BUN 13   CREATININE 1.1   GLUCOSE 90   PHOS 3.6   MG 1.9     LIVER PROFILE:  No results for input(s): AST, ALT, BILIDIR, BILITOT, ALKPHOS in the last 72 hours.  COAG:  PT/INR:No results for input(s): PROTIME, INR in the last 72 hours.  APTT:No results for input(s): APTT in the last 72 hours.    Imaging/Diagnostics   No results found.    ASSESSMENT AND PLAN:    H/o perforated diverticulitis   Patient underwent initial diagnostic laparoscopy converted to open exploratory laparotomy with sigmoid resection, colorectal anastomosis, and creation of diverting loop ileostomy with Dr. Sheffield on 01/05/2024.    He is now POD 1 s/p ileostomy reversal.  - Labs/vitals reviewed. Afebrile and nontachycardic. WBC is WNL. Hgb stable at 12.7. Cr 1.1. Potassium and magnesium   are normal.   - Doing well from surgical standpoint. He is passing flatus and had a BM overnight.   - Will increase to regular, low fiber diet this morning.   - D/c IVF  - Encourage mobilization and OOB  - Encourage IS  - Will remove dressing from surgical site this morning. Instructed the patient to change this daily at home. He can shower beginning tomorrow.   - Multimodal analgesics and antiemetics PRN  - If patient doing well this afternoon will plan to discharge him home    Obesity  - Impacts all aspects of medical care and increases risk of post-operative complications.     Diabetes  - Continue SSI and monitor blood glucose AC&HS    HTN  - Continue home medications    DVT Prophylaxis : Lovenox     Wells Mirza, DNP, FNP-BC  [facility] General Surgery

## 2024-02-27 NOTE — Discharge Summary (Signed)
 Discharge Summary    Patient ID:   Frank Harris  1981-05-29     Admit Date: 02/26/2024  8:03 AM        Discharge Date:  02/27/2024     Admitting Physician: Sheffield Donnajean HERO, MD       Discharge Physician: same    Indication for Admission: Ileostomy reversal     Hospital Course: Admitted for elective ileostomy reversal. Underwent ileostomy reversal with side-to-side anastomosis 02/26/2024.   Postoperatively he was kept overnight for pain control and return of bowel function. He began passing flatus yesterday afternoon and had a bowel movement overnight. He is doing well today. His pain has been adequately controlled with PRN oxycodone  5 mg. He has tolerated a regular, low fiber diet. He is ready for discharge. Prior ileostomy site dressing was changed prior to discharge.     Significant Findings: N/A    Procedures and Treatments Provided: Ileostomy reversal     Discharge Physical Exam:   Lungs: non-labored respirations  Heart: regular rate  Abdomen: softly distended, appropriately tender. Incision clean, dry, intact.  Mental: alert and oriented to person, place, and time      Disposition condition and plan:   Stable; Home    Discharge Medication List:      Medication List        START taking these medications      oxyCODONE  5 MG immediate release tablet  Commonly known as: ROXICODONE   Take 1 tablet by mouth every 6 hours as needed for Pain for up to 3 days. Max Daily Amount: 20 mg            CHANGE how you take these medications      amLODIPine  10 MG tablet  Commonly known as: NORVASC   TAKE 1 TABLET BY MOUTH EVERY DAY  What changed:   how much to take  how to take this  when to take this            CONTINUE taking these medications      acetaminophen  650 MG extended release tablet  Commonly known as: TYLENOL      ALLEGRA-D 12 HOUR PO     fluticasone  50 MCG/ACT nasal spray  Commonly known as: FLONASE   2 sprays by Each Nostril route daily     gabapentin  100 MG capsule  Commonly known as: NEURONTIN   Take 2 capsules  by mouth 3 times daily for 60 days.     losartan  100 MG tablet  Commonly known as: COZAAR   Take 1 tablet by mouth daily     metFORMIN  500 MG extended release tablet  Commonly known as: GLUCOPHAGE -XR  Take 1 tablet by mouth daily (with breakfast)            ASK your doctor about these medications      albuterol  sulfate HFA 108 (90 Base) MCG/ACT inhaler  Commonly known as: PROVENTIL ;VENTOLIN ;PROAIR   2-4 puff as needed Inhalation every 4 hrs for 30 days     lidocaine  5 %  Commonly known as: LIDODERM                Where to Get Your Medications        [id number] [place], [place] - [address] - P [phone] - F [phone]  [address] [place] [address]      Phone: [phone]   oxyCODONE  5 MG immediate release tablet          Activity:  As tolerated. No heavy lifting more than 15 lbs for one month.    Diet:  Low fiber diet as tolerated.    Follow up:  Follow up in our office in 2-3 weeks.    Discharge plan including instructions to the patient took 31 minutes to complete.     Signed:   Wells FORBES Mirza, DNP

## 2024-02-27 NOTE — Care Coordination-Inpatient (Signed)
 Date: 02/27/24 S[facility]     Frank Harris is medically stable and cleared for discharge today and has been cleared by the Interdisciplinary team. Patient will be discharging to Home.    Case Manager discussed discharge plan with patient/family; Pt's family is willing and able to assist with care at discharge.   No further CM needs identified at this time.   -------------------------------------------------------------------------    IP Ileostomy reversal. CM met w/ pt bedside for IA. Spouse/EC Alma present at bedside and will transport home/assist at dc. Prior IADLs and has CPAP only. Prior [facility] w/ [facility] and would use [facility] if needed[facility] [address] PCP Dr. Patton. No anticipated CM dc needs. Home w/ self-care and follow-up appt. (LN)

## 2024-02-28 ENCOUNTER — Encounter

## 2024-02-28 MED ORDER — OXYCODONE HCL 5 MG PO TABS
5 | ORAL_TABLET | Freq: Four times a day (QID) | ORAL | 0 refills | 5.00000 days | Status: AC | PRN
Start: 2024-02-28 — End: 2024-03-02

## 2024-02-28 NOTE — Telephone Encounter (Signed)
 Called [facility] to [facility].

## 2024-03-05 NOTE — Progress Notes (Signed)
 Received fax from [facility] [employer] regarding ostomy supplies-signed, scanned and faxed back

## 2024-03-07 NOTE — Progress Notes (Signed)
 Received fax from [facility] [employer] regarding ostomy supplies-signed, scanned and faxed back

## 2024-03-08 ENCOUNTER — Ambulatory Visit
Admit: 2024-03-08 | Discharge: 2024-03-08 | Payer: PRIVATE HEALTH INSURANCE | Attending: Student in an Organized Health Care Education/Training Program | Primary: Family Medicine

## 2024-03-08 DIAGNOSIS — Z932 Ileostomy status: Principal | ICD-10-CM

## 2024-03-08 NOTE — Progress Notes (Signed)
 Surgery Clinic    Chief Complaint   Patient presents with    Post-Op Check     Patient is S/P ileostomy reversal 02/26/2024.      Mr. Frank Harris comes in today for postop visit.  He had his ileostomy reversed 2 weeks ago.  He is doing extremely well.  He is eating a regular diet and having normal bowel function.  No pain.  On exam his incision is healing very well and is almost fully closed.  We talked about lifting restrictions.  He can follow-up with me in a few weeks.      PCP: Patton Lauraine HERO, MD    Donnajean Griffes, MD  [facility]

## 2024-03-18 ENCOUNTER — Inpatient Hospital Stay: Payer: PRIVATE HEALTH INSURANCE

## 2024-04-18 ENCOUNTER — Encounter: Attending: Family Medicine | Primary: Family Medicine

## 2024-05-03 ENCOUNTER — Ambulatory Visit
Admit: 2024-05-03 | Discharge: 2024-05-03 | Payer: PRIVATE HEALTH INSURANCE | Attending: Student in an Organized Health Care Education/Training Program | Primary: Family Medicine

## 2024-05-03 DIAGNOSIS — Z9049 Acquired absence of other specified parts of digestive tract: Principal | ICD-10-CM

## 2024-05-03 NOTE — Progress Notes (Signed)
"  Surgery Clinic    Chief Complaint   Patient presents with    Post-Op Check     Patient here for his follow up S/P  open sigmoidectomy.        Frank Harris comes in today for follow-up.  He is doing quite well.  His wounds have totally healed.  No GI issues.  Can follow-up with me as needed.    PCP: Patton Lauraine HERO, MD    Donnajean Griffes, MD  [facility]      "

## 2024-05-17 ENCOUNTER — Ambulatory Visit
Admit: 2024-05-17 | Discharge: 2024-05-17 | Payer: PRIVATE HEALTH INSURANCE | Attending: Family Medicine | Primary: Family Medicine

## 2024-05-17 ENCOUNTER — Telehealth

## 2024-05-17 VITALS — BP 138/84 | HR 61 | Ht 65.0 in | Wt 231.6 lb

## 2024-05-17 DIAGNOSIS — E119 Type 2 diabetes mellitus without complications: Principal | ICD-10-CM

## 2024-05-17 LAB — HIV SCREEN: HIV Screen: NEGATIVE

## 2024-05-17 LAB — HEPATITIS C ANTIBODY: Hepatitis C Ab: NEGATIVE

## 2024-05-17 LAB — HEPATITIS B SURFACE ANTIGEN: Hepatitis B Surface Ag: NEGATIVE

## 2024-05-17 LAB — HEMOGLOBIN A1C: Hemoglobin A1C: 5.7 %

## 2024-05-17 MED ORDER — TIRZEPATIDE-WEIGHT MANAGEMENT 2.5 MG/0.5ML SC SOAJ
2.5 | SUBCUTANEOUS | 0 refills | 28.00000 days | Status: DC
Start: 2024-05-17 — End: 2024-05-17

## 2024-05-17 NOTE — Progress Notes (Signed)
"  Chief Complaint:     Follow-up (3 months/A1c due after 12/16-in house 5.7/DM eye exam-June [facility] [address] )      Assessment & Plan   ASSESSMENT/PLAN:    ICD-10-CM    1. Type 2 diabetes mellitus without complication, without long-term current use of insulin  (HCC) [E11.9] Stable E11.9 External Referral To Nutrition Services      2. Benign hypertension [I10] Stable I10       3. OSA (obstructive sleep apnea) [G47.33]  G47.33 tirzepatide -weight management (ZEPBOUND ) 2.5 MG/0.5ML SOAJ subCUTAneous auto-injector pen    Discussed need for compliance with BIPAP. Discussed need for routine sleep med follow-up.      4. Class 2 obesity due to excess calories without serious comorbidity with body mass index (BMI) of 38.0 to 38.9 in adult  E66.812 tirzepatide -weight management (ZEPBOUND ) 2.5 MG/0.5ML SOAJ subCUTAneous auto-injector pen    E66.09 External Referral To Nutrition Services    Z68.38       5. Screening examination for STD (sexually transmitted disease)  Z11.3 HIV Screen     RPR     Hepatitis C Antibody     Hepatitis B Surface Antigen     Chlamydia Trachomatis N.Gonorrhoeae DNA (CT/NG), Urine     Hepatitis B Surface Antigen     Hepatitis C Antibody     RPR     HIV Screen     Routine Venipuncture (63584)     Chlamydia Trachomatis N.Gonorrhoeae DNA (CT/NG), Urine          Blood pressure is reasonable today.  Continue current medications. Discussed possible zepbound  due to OSA or mounjaro . Will send in zepbound . POC A1c is 5.7. Encouraged healthy eating and physical activity efforts. Will refer to nutritionist. Discussed need compliance with bipap and  for follow-up with sleep med.     Check labs to screen STDs per patient request.     Return in about 3 months (around 08/15/2024).         Subjective   SUBJECTIVE/OBJECTIVE:    Patient with past medical history of hypertension, diabetes, OSA on BIPAP here for chronic medical follow-up.     Taking medications as rx.      OSA-- admits to not using BIPAP.      Has gained  some weight. Notes he has not been eating healthy.     ROS as per HPI or otherwise negative.           Objective   Vitals:    05/17/24 0915   BP: 138/84   Pulse: 61   SpO2: 98%       GENERAL: The patient is in no apparent distress. Alert and oriented. HEENT: Head is normocephalic and atraumatic. Extraocular muscles are intact. Pupils are equal, Conjunctiva normal.  NECK: Supple. No Lymphadenopathy LUNGS: Clear to auscultation bilaterally. Breath Sounds equal. HEART: Regular rate and rhythm without murmur. No edema ABDOMEN: Soft, nontender, and nondistended. . Musculoskeletal: No deformity. Gait WNL. No swelling noted. NEUROLOGIC: Alert and Oriented. No focal deficits. PSYCHIATRIC: Cooperative, mood appropriate. SKIN: No rash noted.                 An electronic signature was used to authenticate this note.    --Lauraine CHRISTELLA Melody, MD     "

## 2024-05-17 NOTE — Telephone Encounter (Signed)
"  Attempted PA on [email or web address] was not able to submit as it said to contact insurance company. Called [employer] spoke with Ina to initiate PA after answering questions she advised that Zepbound  is excluded from plan.  "

## 2024-05-18 LAB — RPR: RPR: NONREACTIVE

## 2024-05-18 LAB — C.TRACHOMATIS N.GONORRHOEAE DNA, URINE
Chlamydia Trachomatis, NAA, Urine: NEGATIVE
Neisseria gonorrhoeae, NAA, Urine: NEGATIVE

## 2024-05-18 MED ORDER — TIRZEPATIDE 2.5 MG/0.5ML SC SOAJ
2.5 | SUBCUTANEOUS | 0 refills | 28.00000 days | Status: AC
Start: 2024-05-18 — End: ?

## 2024-05-19 NOTE — Progress Notes (Signed)
"  PA submitted on [email or web address] for Mounjaro -pending insurance response  "

## 2024-05-26 NOTE — Progress Notes (Signed)
"  RjdzPi:[id number];Dujuld:Jeemnczi;Review Type:Prior Auth;Coverage Start Date:05/19/2024;Coverage End Date:05/25/2025;. Authorization Expiration Date: May 25, 2025   "

## 2024-06-27 MED ORDER — METFORMIN HCL ER 500 MG PO TB24
500 | ORAL_TABLET | Freq: Every day | ORAL | 1 refills | 90.00000 days | Status: AC
Start: 2024-06-27 — End: ?

## 2024-06-27 MED ORDER — AMLODIPINE BESYLATE 10 MG PO TABS
10 | ORAL_TABLET | ORAL | 1 refills | 90.00000 days | Status: AC
Start: 2024-06-27 — End: ?

## 2024-06-27 NOTE — Telephone Encounter (Signed)
"  Medication(s):  metFORMIN  (GLUCOPHAGE -XR) 500 MG extended release tablet   1x a day    90 day supply    amLODIPine  (NORVASC ) 10 MG tablet   1x a day    90 day supply    Have you had changes in Rx since last visit: No    Pharmacy:  [id number] - [place], [place] - [address] [place] [phone]    Last visit: 05/17/24  Next visit: 08/15/24    "

## 2024-08-07 ENCOUNTER — Encounter: Attending: Surgery | Primary: Family Medicine
